# Patient Record
Sex: Female | Born: 1965 | Race: White | Hispanic: No | Smoking: Former smoker
Health system: Southern US, Community
[De-identification: ages and names within clinical notes are randomized; demographics above are authoritative.]

## PROBLEM LIST (undated history)

## (undated) DIAGNOSIS — F329 Major depressive disorder, single episode, unspecified: Secondary | ICD-10-CM

## (undated) DIAGNOSIS — F32A Depression, unspecified: Secondary | ICD-10-CM

## (undated) DIAGNOSIS — J45909 Unspecified asthma, uncomplicated: Secondary | ICD-10-CM

## (undated) DIAGNOSIS — G43909 Migraine, unspecified, not intractable, without status migrainosus: Secondary | ICD-10-CM

---

## 2008-08-11 ENCOUNTER — Emergency Department (HOSPITAL_COMMUNITY): Admission: EM | Admit: 2008-08-11 | Discharge: 2008-08-11 | Payer: Self-pay | Admitting: Emergency Medicine

## 2010-04-15 ENCOUNTER — Inpatient Hospital Stay (HOSPITAL_COMMUNITY)
Admission: AD | Admit: 2010-04-15 | Discharge: 2010-04-15 | Disposition: A | Discharge status: Home / Self Care | Payer: Self-pay | Source: Ambulatory Visit | Attending: Obstetrics and Gynecology | Admitting: Obstetrics and Gynecology

## 2010-04-15 DIAGNOSIS — N644 Mastodynia: Secondary | ICD-10-CM | POA: Insufficient documentation

## 2013-09-15 ENCOUNTER — Encounter (HOSPITAL_COMMUNITY): Payer: Self-pay | Admitting: Emergency Medicine

## 2013-09-15 ENCOUNTER — Emergency Department (HOSPITAL_COMMUNITY): Payer: BC Managed Care – PPO

## 2013-09-15 ENCOUNTER — Emergency Department (HOSPITAL_COMMUNITY)
Admission: EM | Admit: 2013-09-15 | Discharge: 2013-09-15 | Disposition: A | Discharge status: Home / Self Care | Payer: BC Managed Care – PPO | Attending: Emergency Medicine | Admitting: Emergency Medicine

## 2013-09-15 DIAGNOSIS — Z79899 Other long term (current) drug therapy: Secondary | ICD-10-CM | POA: Insufficient documentation

## 2013-09-15 DIAGNOSIS — J45909 Unspecified asthma, uncomplicated: Secondary | ICD-10-CM | POA: Insufficient documentation

## 2013-09-15 DIAGNOSIS — F329 Major depressive disorder, single episode, unspecified: Secondary | ICD-10-CM | POA: Insufficient documentation

## 2013-09-15 DIAGNOSIS — R1084 Generalized abdominal pain: Secondary | ICD-10-CM | POA: Insufficient documentation

## 2013-09-15 DIAGNOSIS — F3289 Other specified depressive episodes: Secondary | ICD-10-CM | POA: Insufficient documentation

## 2013-09-15 DIAGNOSIS — R1030 Lower abdominal pain, unspecified: Secondary | ICD-10-CM

## 2013-09-15 DIAGNOSIS — Z87891 Personal history of nicotine dependence: Secondary | ICD-10-CM | POA: Insufficient documentation

## 2013-09-15 DIAGNOSIS — R109 Unspecified abdominal pain: Secondary | ICD-10-CM | POA: Insufficient documentation

## 2013-09-15 HISTORY — DX: Depression, unspecified: F32.A

## 2013-09-15 HISTORY — DX: Major depressive disorder, single episode, unspecified: F32.9

## 2013-09-15 HISTORY — DX: Unspecified asthma, uncomplicated: J45.909

## 2013-09-15 LAB — COMPREHENSIVE METABOLIC PANEL
ALK PHOS: 70 U/L (ref 39–117)
ALT: 19 U/L (ref 0–35)
AST: 17 U/L (ref 0–37)
Albumin: 3.6 g/dL (ref 3.5–5.2)
Anion gap: 11 (ref 5–15)
BILIRUBIN TOTAL: 0.6 mg/dL (ref 0.3–1.2)
BUN: 10 mg/dL (ref 6–23)
CALCIUM: 8.8 mg/dL (ref 8.4–10.5)
CHLORIDE: 100 meq/L (ref 96–112)
CO2: 25 meq/L (ref 19–32)
Creatinine, Ser: 0.75 mg/dL (ref 0.50–1.10)
GLUCOSE: 79 mg/dL (ref 70–99)
POTASSIUM: 3.9 meq/L (ref 3.7–5.3)
SODIUM: 136 meq/L — AB (ref 137–147)
Total Protein: 7.3 g/dL (ref 6.0–8.3)

## 2013-09-15 LAB — CBC WITH DIFFERENTIAL/PLATELET
Basophils Absolute: 0 10*3/uL (ref 0.0–0.1)
Basophils Relative: 0 % (ref 0–1)
EOS PCT: 1 % (ref 0–5)
Eosinophils Absolute: 0.1 10*3/uL (ref 0.0–0.7)
HCT: 42.1 % (ref 36.0–46.0)
Hemoglobin: 14.2 g/dL (ref 12.0–15.0)
LYMPHS ABS: 1.5 10*3/uL (ref 0.7–4.0)
LYMPHS PCT: 19 % (ref 12–46)
MCH: 32.8 pg (ref 26.0–34.0)
MCHC: 33.7 g/dL (ref 30.0–36.0)
MCV: 97.2 fL (ref 78.0–100.0)
Monocytes Absolute: 0.6 10*3/uL (ref 0.1–1.0)
Monocytes Relative: 8 % (ref 3–12)
NEUTROS ABS: 5.6 10*3/uL (ref 1.7–7.7)
Neutrophils Relative %: 72 % (ref 43–77)
PLATELETS: 253 10*3/uL (ref 150–400)
RBC: 4.33 MIL/uL (ref 3.87–5.11)
RDW: 12 % (ref 11.5–15.5)
WBC: 7.8 10*3/uL (ref 4.0–10.5)

## 2013-09-15 LAB — URINALYSIS, ROUTINE W REFLEX MICROSCOPIC
BILIRUBIN URINE: NEGATIVE
Glucose, UA: NEGATIVE mg/dL
HGB URINE DIPSTICK: NEGATIVE
KETONES UR: NEGATIVE mg/dL
Leukocytes, UA: NEGATIVE
Nitrite: NEGATIVE
PROTEIN: NEGATIVE mg/dL
Specific Gravity, Urine: 1.007 (ref 1.005–1.030)
UROBILINOGEN UA: 0.2 mg/dL (ref 0.0–1.0)
pH: 6.5 (ref 5.0–8.0)

## 2013-09-15 LAB — POC URINE PREG, ED: Preg Test, Ur: NEGATIVE

## 2013-09-15 LAB — I-STAT CG4 LACTIC ACID, ED: LACTIC ACID, VENOUS: 0.5 mmol/L (ref 0.5–2.2)

## 2013-09-15 LAB — LIPASE, BLOOD: LIPASE: 22 U/L (ref 11–59)

## 2013-09-15 MED ORDER — IOHEXOL 300 MG/ML  SOLN
25.0000 mL | INTRAMUSCULAR | Status: AC | PRN
  Administered 2013-09-15 (×2): 25 mL via ORAL

## 2013-09-15 MED ORDER — IOHEXOL 300 MG/ML  SOLN
100.0000 mL | Freq: Once | INTRAMUSCULAR | Status: AC | PRN
  Administered 2013-09-15: 100 mL via INTRAVENOUS

## 2013-09-15 NOTE — ED Notes (Signed)
Patient drinking PO, will notify RN when completed first cup.

## 2013-09-15 NOTE — ED Notes (Signed)
MD at bedside. 

## 2013-09-15 NOTE — ED Notes (Signed)
Patient transported to CT 

## 2013-09-15 NOTE — ED Notes (Signed)
Pt. Stated, I started having abdominal pain for 5 days off and on.

## 2013-09-15 NOTE — Discharge Instructions (Signed)
Abdominal Pain, Women °Abdominal (stomach, pelvic, or belly) pain can be caused by many things. It is important to tell your doctor: °· The location of the pain. °· Does it come and go or is it present all the time? °· Are there things that start the pain (eating certain foods, exercise)? °· Are there other symptoms associated with the pain (fever, nausea, vomiting, diarrhea)? °All of this is helpful to know when trying to find the cause of the pain. °CAUSES  °· Stomach: virus or bacteria infection, or ulcer. °· Intestine: appendicitis (inflamed appendix), regional ileitis (Crohn's disease), ulcerative colitis (inflamed colon), irritable bowel syndrome, diverticulitis (inflamed diverticulum of the colon), or cancer of the stomach or intestine. °· Gallbladder disease or stones in the gallbladder. °· Kidney disease, kidney stones, or infection. °· Pancreas infection or cancer. °· Fibromyalgia (pain disorder). °· Diseases of the female organs: °¨ Uterus: fibroid (non-cancerous) tumors or infection. °¨ Fallopian tubes: infection or tubal pregnancy. °¨ Ovary: cysts or tumors. °¨ Pelvic adhesions (scar tissue). °¨ Endometriosis (uterus lining tissue growing in the pelvis and on the pelvic organs). °¨ Pelvic congestion syndrome (female organs filling up with blood just before the menstrual period). °¨ Pain with the menstrual period. °¨ Pain with ovulation (producing an egg). °¨ Pain with an IUD (intrauterine device, birth control) in the uterus. °¨ Cancer of the female organs. °· Functional pain (pain not caused by a disease, may improve without treatment). °· Psychological pain. °· Depression. °DIAGNOSIS  °Your doctor will decide the seriousness of your pain by doing an examination. °· Blood tests. °· X-rays. °· Ultrasound. °· CT scan (computed tomography, special type of X-ray). °· MRI (magnetic resonance imaging). °· Cultures, for infection. °· Barium enema (dye inserted in the large intestine, to better view it with  X-rays). °· Colonoscopy (looking in intestine with a lighted tube). °· Laparoscopy (minor surgery, looking in abdomen with a lighted tube). °· Major abdominal exploratory surgery (looking in abdomen with a large incision). °TREATMENT  °The treatment will depend on the cause of the pain.  °· Many cases can be observed and treated at home. °· Over-the-counter medicines recommended by your caregiver. °· Prescription medicine. °· Antibiotics, for infection. °· Birth control pills, for painful periods or for ovulation pain. °· Hormone treatment, for endometriosis. °· Nerve blocking injections. °· Physical therapy. °· Antidepressants. °· Counseling with a psychologist or psychiatrist. °· Minor or major surgery. °HOME CARE INSTRUCTIONS  °· Do not take laxatives, unless directed by your caregiver. °· Take over-the-counter pain medicine only if ordered by your caregiver. Do not take aspirin because it can cause an upset stomach or bleeding. °· Try a clear liquid diet (broth or water) as ordered by your caregiver. Slowly move to a bland diet, as tolerated, if the pain is related to the stomach or intestine. °· Have a thermometer and take your temperature several times a day, and record it. °· Bed rest and sleep, if it helps the pain. °· Avoid sexual intercourse, if it causes pain. °· Avoid stressful situations. °· Keep your follow-up appointments and tests, as your caregiver orders. °· If the pain does not go away with medicine or surgery, you may try: °¨ Acupuncture. °¨ Relaxation exercises (yoga, meditation). °¨ Group therapy. °¨ Counseling. °SEEK MEDICAL CARE IF:  °· You notice certain foods cause stomach pain. °· Your home care treatment is not helping your pain. °· You need stronger pain medicine. °· You want your IUD removed. °· You feel faint or   lightheaded. °· You develop nausea and vomiting. °· You develop a rash. °· You are having side effects or an allergy to your medicine. °SEEK IMMEDIATE MEDICAL CARE IF:  °· Your  pain does not go away or gets worse. °· You have a fever. °· Your pain is felt only in portions of the abdomen. The right side could possibly be appendicitis. The left lower portion of the abdomen could be colitis or diverticulitis. °· You are passing blood in your stools (bright red or black tarry stools, with or without vomiting). °· You have blood in your urine. °· You develop chills, with or without a fever. °· You pass out. °MAKE SURE YOU:  °· Understand these instructions. °· Will watch your condition. °· Will get help right away if you are not doing well or get worse. °Document Released: 11/29/2006 Document Revised: 06/18/2013 Document Reviewed: 12/19/2008 °Exit[employer]® Patient Information ©2015 [employer]. This information is not intended to replace advice given to you by your health care provider. Make sure you discuss any questions you have with your health care provider. ° °

## 2013-09-15 NOTE — ED Provider Notes (Signed)
CSN: [id number]     Arrival date & time 09/15/13  1024 History   First MD Initiated Contact with Patient 09/15/13 1038     Chief Complaint  Patient presents with  . Abdominal Pain     (Consider location/radiation/quality/duration/timing/severity/associated sxs/prior Treatment) Patient is a 48 y.o. female presenting with abdominal pain. The history is provided by the patient. No language interpreter was used.  Abdominal Pain Pain location:  Suprapubic, LLQ and RLQ Pain quality: aching and cramping   Pain radiates to:  Back Pain severity:  Severe Onset quality:  Sudden Duration:  1 week Timing:  Intermittent Progression:  Waxing and waning Chronicity:  Recurrent (1 similar episode about 2 months ago) Context: not recent illness, not sick contacts, not suspicious food intake and not trauma   Relieved by:  Nothing Worsened by:  Nothing tried Ineffective treatments:  None tried Associated symptoms: fever and nausea   Associated symptoms: no chest pain, no chills, no constipation, no cough, no diarrhea, no dysuria, no fatigue, no hematochezia, no hematuria, no shortness of breath, no sore throat, no vaginal bleeding, no vaginal discharge and no vomiting   Associated symptoms comment:  Urinary frequency  Fever:    Duration:  1 day   Timing:  Intermittent   Temp source:  Subjective   Progression:  Waxing and waning   Past Medical History  Diagnosis Date  . Depression   . Asthma    History reviewed. No pertinent past surgical history. No family history on file. History  Substance Use Topics  . Smoking status: Former Games developer  . Smokeless tobacco: Not on file  . Alcohol Use: Yes   OB History   Grav Para Term Preterm Abortions TAB SAB Ect Mult Living                 Review of Systems  Constitutional: Positive for fever. Negative for chills, diaphoresis, activity change, appetite change and fatigue.  HENT: Negative for congestion, facial swelling, rhinorrhea and sore throat.    Eyes: Negative for photophobia and discharge.  Respiratory: Negative for cough, chest tightness and shortness of breath.   Cardiovascular: Negative for chest pain, palpitations and leg swelling.  Gastrointestinal: Positive for nausea and abdominal pain. Negative for vomiting, diarrhea, constipation and hematochezia.  Endocrine: Negative for polydipsia and polyuria.  Genitourinary: Negative for dysuria, frequency, hematuria, vaginal bleeding, vaginal discharge, difficulty urinating and pelvic pain.  Musculoskeletal: Negative for arthralgias, back pain, neck pain and neck stiffness.  Skin: Negative for color change and wound.  Allergic/Immunologic: Negative for immunocompromised state.  Neurological: Negative for facial asymmetry, weakness, numbness and headaches.  Hematological: Does not bruise/bleed easily.  Psychiatric/Behavioral: Negative for confusion and agitation.      Allergies  Aspirin; Other; and Peanuts  Home Medications   Prior to Admission medications   Medication Sig Start Date End Date Taking? Authorizing Provider  albuterol (PROVENTIL HFA;VENTOLIN HFA) 108 (90 BASE) MCG/ACT inhaler Inhale 2 puffs into the lungs every 6 (six) hours as needed for wheezing or shortness of breath.   Yes Historical Provider, MD  buPROPion (WELLBUTRIN SR) 150 MG 12 hr tablet Take 150 mg by mouth every evening.   Yes Historical Provider, MD  buPROPion (WELLBUTRIN SR) 200 MG 12 hr tablet Take 200 mg by mouth every morning.   Yes Historical Provider, MD  fluticasone (FLONASE) 50 MCG/ACT nasal spray Place 2 sprays into both nostrils daily.   Yes Historical Provider, MD  Lactobacillus (ACIDOPHILUS PO) Take 1 capsule by mouth every  morning.   Yes Historical Provider, MD  Multiple Vitamin (MULTIVITAMIN WITH MINERALS) TABS tablet Take 1 tablet by mouth daily.   Yes Historical Provider, MD   BP 114/80  Pulse 71  Temp(Src) 97.5 F (36.4 C) (Oral)  Resp 16  Ht 5\' 4"  (1.626 m)  Wt 136 lb (61.689 kg)   BMI 23.33 kg/m2  SpO2 99%  LMP 09/01/2013 Physical Exam  Constitutional: She is oriented to person, place, and time. She appears well-developed and well-nourished. No distress.  HENT:  Head: Normocephalic and atraumatic.  Mouth/Throat: No oropharyngeal exudate.  Eyes: Pupils are equal, round, and reactive to light.  Neck: Normal range of motion. Neck supple.  Cardiovascular: Normal rate, regular rhythm and normal heart sounds.  Exam reveals no gallop and no friction rub.   No murmur heard. Pulmonary/Chest: Effort normal and breath sounds normal. No respiratory distress. She has no wheezes. She has no rales.  Abdominal: Soft. Bowel sounds are normal. She exhibits no distension and no mass. There is tenderness in the suprapubic area. There is no rigidity, no rebound, no guarding and no CVA tenderness.  Musculoskeletal: Normal range of motion. She exhibits no edema and no tenderness.  Neurological: She is alert and oriented to person, place, and time.  Skin: Skin is warm and dry.  Psychiatric: She has a normal mood and affect.    ED Course  Procedures (including critical care time) Labs Review Labs Reviewed  COMPREHENSIVE METABOLIC PANEL - Abnormal; Notable for the following:    Sodium 136 (*)    All other components within normal limits  URINE CULTURE  CBC WITH DIFFERENTIAL  URINALYSIS, ROUTINE W REFLEX MICROSCOPIC  LIPASE, BLOOD  POC URINE PREG, ED  I-STAT CG4 LACTIC ACID, ED    Imaging Review Ct Abdomen Pelvis W Contrast  09/15/2013   CLINICAL DATA:  Five day history of intermittent lower abdominal pain  EXAM: CT ABDOMEN AND PELVIS WITH CONTRAST  TECHNIQUE: Multidetector CT imaging of the abdomen and pelvis was performed using the standard protocol following bolus administration of intravenous contrast.  CONTRAST:  OMNIPAQUE IOHEXOL 300 MG/ML  SOLN  COMPARISON:  None.  FINDINGS: Lower Chest: The lung bases are clear. Visualized cardiac structures are within normal limits  for size. No pericardial effusion. Unremarkable visualized distal thoracic esophagus.  Abdomen: Unremarkable CT appearance of the stomach, duodenum, spleen, adrenal glands, pancreas and liver. Gallbladder is unremarkable. No intra or extrahepatic biliary ductal dilatation. Normal renal parenchymal enhancement. No hydronephrosis. Punctate high attenuation in the lower pole of the right kidney consistent with a tiny renal stone. Large colonic stool burden suggests constipation. Normal appendix in the right lower quadrant. Incidental note is made of a short segment enteroenteric intussusception in the left hemi abdomen. This is likely incidental and not clinically significant. No free fluid or suspicious adenopathy  Pelvis: 3.9 cm water attenuation cyst affiliated with the left adnexa. The uterus and right adnexa are unremarkable. Unremarkable appearance of the bladder.  Bones/Soft Tissues: No acute fracture or aggressive appearing lytic or blastic osseous lesion.  Vascular: No significant atherosclerotic vascular disease, aneurysmal dilatation or acute abnormality.  IMPRESSION: 1. No acute abnormality in the abdomen or pelvis to explain the patient's clinical symptoms. 2. Punctate nonobstructing stone in the lower pole of the right kidney. 3. 3.9 cm water attenuation cyst affiliated with the left ovary. If the patient is not postmenopausal this is almost certainly benign and a further imaging follow-up would not be required. If, however the patient is  postmenopausal then this finding is still likely benign but warrants further evaluation with nonemergent pelvic ultrasound.   Electronically Signed   By: Malachy MoanHeath  McCullough M.D.   On: 09/15/2013 15:18     EKG Interpretation None      MDM   Final diagnoses:  Abdominal pain, lower    Pt is a 48 y.o. female with Pmhx as above who presents with about 1wk of intermittent low abdominal pain w/ radiation to the back, nausea, urinary frequency, subjective  fever/chills. No vom, d/a, hematuria or blood in stool, vaginal bleeding or d/c. Pt's pain currently much improved since last night. On PE, VSS, pt in NAD. +suprapubic ttp w/o rebound or guarding. No CVA tenderness. CBC, CMP, lipase unremarkable. UA nml. No acute findings on CT ab/pelvis. Pt as tolerated PO contrast w/o difficulty and is well-appearing. Will d/c home w/ Return precautions given for new or worsening symptoms including worsening pain, fever, inability to tolerate PO.           Shanna CiscoMegan E Docherty, MD 09/15/13 [phone]

## 2013-09-16 LAB — URINE CULTURE: Colony Count: 25000

## 2013-11-12 ENCOUNTER — Emergency Department (HOSPITAL_COMMUNITY): Payer: BC Managed Care – PPO

## 2013-11-12 ENCOUNTER — Emergency Department (HOSPITAL_COMMUNITY)
Admission: EM | Admit: 2013-11-12 | Discharge: 2013-11-12 | Discharge status: Against Medical Advice | Payer: BC Managed Care – PPO | Attending: Emergency Medicine | Admitting: Emergency Medicine

## 2013-11-12 ENCOUNTER — Encounter (HOSPITAL_COMMUNITY): Payer: Self-pay | Admitting: Emergency Medicine

## 2013-11-12 DIAGNOSIS — Z87891 Personal history of nicotine dependence: Secondary | ICD-10-CM | POA: Diagnosis not present

## 2013-11-12 DIAGNOSIS — J45909 Unspecified asthma, uncomplicated: Secondary | ICD-10-CM | POA: Diagnosis not present

## 2013-11-12 HISTORY — DX: Migraine, unspecified, not intractable, without status migrainosus: G43.909

## 2013-11-12 LAB — CBC
HCT: 44.7 % (ref 36.0–46.0)
Hemoglobin: 15.2 g/dL — ABNORMAL HIGH (ref 12.0–15.0)
MCH: 33.1 pg (ref 26.0–34.0)
MCHC: 34 g/dL (ref 30.0–36.0)
MCV: 97.4 fL (ref 78.0–100.0)
Platelets: 270 10*3/uL (ref 150–400)
RBC: 4.59 MIL/uL (ref 3.87–5.11)
RDW: 12.6 % (ref 11.5–15.5)
WBC: 5.2 10*3/uL (ref 4.0–10.5)

## 2013-11-12 LAB — BASIC METABOLIC PANEL
Anion gap: 14 (ref 5–15)
BUN: 14 mg/dL (ref 6–23)
CALCIUM: 9.4 mg/dL (ref 8.4–10.5)
CO2: 27 mEq/L (ref 19–32)
Chloride: 102 mEq/L (ref 96–112)
Creatinine, Ser: 0.88 mg/dL (ref 0.50–1.10)
GFR, EST AFRICAN AMERICAN: 87 mL/min — AB (ref >90)
GFR, EST NON AFRICAN AMERICAN: 75 mL/min — AB (ref >90)
Glucose, Bld: 83 mg/dL (ref 70–99)
POTASSIUM: 4.1 meq/L (ref 3.7–5.3)
SODIUM: 143 meq/L (ref 137–147)

## 2013-11-12 NOTE — ED Notes (Signed)
Per pt, states she has an asthma attack early this am-cleared up around 7 am-states now she just does not feel good-no difficulty breathing

## 2013-11-12 NOTE — ED Notes (Signed)
Pt and a female visitor came up to the Registration window asking how much longer before she was put in a room.  This Management consultant and explained that unfortunately there have been other patients that needed to be taken back first.  I explained that she had two people ahead of her, we had a few patients up for discharge, and we would get her back hopefully, in 15-30 mins.  Pt's female visitor stated that they would like to leave and she needs a note for work.  This RN told them that I would write her a note.

## 2013-11-12 NOTE — ED Notes (Addendum)
After having to search for a template this RN provided a note that stated that the Pt had been here and left prior to being seen by a provider/prior to treatment.  The Pt's female visitor stated "what happened to the 15 minutes that you told me 20 minutes ago?  That last lady went back and hadn't been waiting as long as we have.  If I would have been up to the window making a scene, then we would have gotten back.  Others have been cussing and fussing and then, magically, within 5 minutes, they have a room.  We've tried to be nice, but we should have made a scene."  This RN apologized, again, and briefly explained that the last Pt's condition caused her to get a room first and I can only provide time estimates.  The female visitor then asked for this Charge RN's first and last night, which were provided.  Female visitor took the note and walked the Pt out of the department.  This Management consultant, again, for the delay.      Registration reported to this Charge RN that the female visitor was taking pictures and kept repeating that they should have made a scene.

## 2014-01-20 ENCOUNTER — Encounter (HOSPITAL_COMMUNITY): Payer: Self-pay | Admitting: *Deleted

## 2014-01-20 ENCOUNTER — Emergency Department (HOSPITAL_COMMUNITY)
Admission: EM | Admit: 2014-01-20 | Discharge: 2014-01-21 | Disposition: A | Discharge status: Home / Self Care | Payer: BC Managed Care – PPO | Attending: Emergency Medicine | Admitting: Emergency Medicine

## 2014-01-20 ENCOUNTER — Emergency Department (HOSPITAL_COMMUNITY): Payer: BC Managed Care – PPO

## 2014-01-20 DIAGNOSIS — Z7951 Long term (current) use of inhaled steroids: Secondary | ICD-10-CM | POA: Insufficient documentation

## 2014-01-20 DIAGNOSIS — G43909 Migraine, unspecified, not intractable, without status migrainosus: Secondary | ICD-10-CM | POA: Diagnosis not present

## 2014-01-20 DIAGNOSIS — R059 Cough, unspecified: Secondary | ICD-10-CM

## 2014-01-20 DIAGNOSIS — F419 Anxiety disorder, unspecified: Secondary | ICD-10-CM | POA: Diagnosis not present

## 2014-01-20 DIAGNOSIS — F329 Major depressive disorder, single episode, unspecified: Secondary | ICD-10-CM | POA: Insufficient documentation

## 2014-01-20 DIAGNOSIS — Z87891 Personal history of nicotine dependence: Secondary | ICD-10-CM | POA: Diagnosis not present

## 2014-01-20 DIAGNOSIS — J069 Acute upper respiratory infection, unspecified: Secondary | ICD-10-CM | POA: Diagnosis not present

## 2014-01-20 DIAGNOSIS — Z79899 Other long term (current) drug therapy: Secondary | ICD-10-CM | POA: Insufficient documentation

## 2014-01-20 DIAGNOSIS — J45909 Unspecified asthma, uncomplicated: Secondary | ICD-10-CM | POA: Diagnosis present

## 2014-01-20 DIAGNOSIS — R05 Cough: Secondary | ICD-10-CM

## 2014-01-20 MED ORDER — ALBUTEROL SULFATE (2.5 MG/3ML) 0.083% IN NEBU
2.5000 mg | INHALATION_SOLUTION | Freq: Once | RESPIRATORY_TRACT | Status: AC
  Administered 2014-01-20: 2.5 mg via RESPIRATORY_TRACT
  Filled 2014-01-20: qty 3

## 2014-01-20 NOTE — ED Provider Notes (Signed)
CSN: [id number]     Arrival date & time 01/20/14  2221 History  This chart was scribed for Selinda MichaelsLauren Par, PA-C, working with Toy CookeyMegan Docherty, MD by Chestine SporeSoijett Blue, ED Scribe. The patient was seen in room TR09C/TR09C at 10:53 PM.   Chief Complaint  Patient presents with  . Asthma     HPI Comments: Pam Thomas is a 48 y.o. female who presents to the Emergency Department complaining of asthma onset PTA. She feels like her chest is "full" and yesterday she felt that she could cough it up but couldn't. She was seen at her PCP 2 days ago and was Rx augmentin that was changed to Avelox. She has an inhaler that she has used three times today. She reports that when it gets bad she has anxiety. She states that she is having associated symptoms of lightheaded, dizziness, chest tightness, cough. She states that she has tried her inhaler with no relief for her symptoms. Patient also complains of lack of sleep over the last one week.  She reports waking up "gasping for air, but denies shortness of breath, relating it to anxiety. No recent travel, family history or personal history of DVT/PE, lower extremity swelling, smoking, cancer, or exogenous estrogen.  She denies wheezing, CP, leg swelling, fever, and any other symptoms. She reports that her breathing is now mildly resolved. She reports that she will wake up gasping for air at night and that has happened before. She denies smoking.  The history is provided by the patient. No language interpreter was used.    Past Medical History  Diagnosis Date  . Depression   . Asthma   . Migraine    History reviewed. No pertinent past surgical history. History reviewed. No pertinent family history. History  Substance Use Topics  . Smoking status: Former Games developermoker  . Smokeless tobacco: Not on file  . Alcohol Use: Yes   OB History    No data available     Review of Systems  Constitutional: Negative for fever.  Respiratory: Positive for cough and chest tightness.  Negative for wheezing.   Cardiovascular: Negative for chest pain, palpitations and leg swelling.  Neurological: Positive for light-headedness. Negative for syncope, numbness and headaches.      Allergies  Aspirin; Other; and Peanuts  Home Medications   Prior to Admission medications   Medication Sig Start Date End Date Taking? Authorizing Provider  albuterol (PROVENTIL HFA;VENTOLIN HFA) 108 (90 BASE) MCG/ACT inhaler Inhale 2 puffs into the lungs every 6 (six) hours as needed for wheezing or shortness of breath.    Historical Provider, MD  buPROPion (WELLBUTRIN SR) 150 MG 12 hr tablet Take 150 mg by mouth every evening.    Historical Provider, MD  buPROPion (WELLBUTRIN SR) 200 MG 12 hr tablet Take 200 mg by mouth every morning.    Historical Provider, MD  fluticasone (FLONASE) 50 MCG/ACT nasal spray Place 2 sprays into both nostrils daily.    Historical Provider, MD  Lactobacillus (ACIDOPHILUS PO) Take 1 capsule by mouth every morning.    Historical Provider, MD  Multiple Vitamin (MULTIVITAMIN WITH MINERALS) TABS tablet Take 1 tablet by mouth daily.    Historical Provider, MD   BP 147/74 mmHg  Pulse 77  Temp(Src) 97.8 F (36.6 C)  Resp 18  Ht 5\' 4"  (1.626 m)  Wt 137 lb (62.143 kg)  BMI 23.50 kg/m2  SpO2 100% Physical Exam  Constitutional: She is oriented to person, place, and time. She appears well-developed and well-nourished.  Non-toxic appearance. She does not have a sickly appearance. No distress.  HENT:  Head: Normocephalic and atraumatic.  Eyes: EOM are normal.  Neck: Neck supple.  Cardiovascular: Normal rate, regular rhythm and normal heart sounds.   No lower extremity edema.  Pulmonary/Chest: Effort normal and breath sounds normal. No respiratory distress. She has no decreased breath sounds. She has no wheezes. She has no rhonchi. She has no rales.  Patient is able to speak in complete sentences.   Musculoskeletal: Normal range of motion.  Neurological: She is alert  and oriented to person, place, and time.  Skin: Skin is warm and dry.  Psychiatric: Her behavior is normal. Her mood appears anxious.  Nursing note and vitals reviewed.   ED Course  Procedures (including critical care time) COORDINATION OF CARE: 11:00 PM-Discussed treatment plan which includes CXR, breathing treating, EKG with pt at bedside and pt agreed to plan.   Labs Review Labs Reviewed - No data to display  Imaging Review Dg Chest 2 View  01/21/2014   CLINICAL DATA:  Asthma attack tonight, cough, symptoms improved after inhaler use, former smoker  EXAM: CHEST  2 VIEW  COMPARISON:  11/12/2013  FINDINGS: Normal heart size, mediastinal contours, and pulmonary vascularity.  Lungs clear.  No pleural effusion or pneumothorax.  Bones unremarkable.  IMPRESSION: Normal exam.   Electronically Signed   By: Ulyses SouthwardMark  Boles M.D.   On: 01/21/2014 00:12     EKG Interpretation None      MDM   Final diagnoses:  Cough  URI (upper respiratory infection)   Patient presents with upper respiratory symptoms and asthma symptoms. Lungs clear to auscultation, patient SPO2 100% on room air, RRRR, afebrile. Patients symptoms likely combination of URI and anxiety. X-ray ordered, plan to walk the patient with pulse ox. Doubt PE or ACS. Patient is not having any chest pain or shortness of breath. X-ray negative, patient maintain SPO2 97-99% on room air with ambulation.  12:26 AM Reevaluation patient resting comfortably in room. She reports relief after nebulizer treatment. Upon further discussion patient admits she has had a "rough week ", stating that her daughter was missing for several days which caused her a lot of anxiety. She reports she did not get much sleep due to this and that the cold and added stress and lack of sleep made her feel worse. Discussed need for Mucinex DM, with likely viral illness and follow-up with primary care. Discussed imaging results, and treatment plan with the patient. Return  precautions given. Reports understanding and no other concerns at this time.  Patient is stable for discharge at this time. Meds given in ED:  Medications  albuterol (PROVENTIL) (2.5 MG/3ML) 0.083% nebulizer solution 2.5 mg (2.5 mg Nebulization Given 01/20/14 2347)    New Prescriptions   No medications on file   I personally performed the services described in this documentation, which was scribed in my presence. The recorded information has been reviewed and is accurate.    Mellody DrownLauren Tangi Shroff, PA-C 01/21/14 [id number]  Toy CookeyMegan Docherty, MD 01/21/14 1302

## 2014-01-20 NOTE — ED Notes (Signed)
Maintained sat of 97-99 while walking fast.  P.  84

## 2014-01-20 NOTE — ED Notes (Signed)
Pt in c/o asthma symptoms, states she has had an URI for the last week, increased coughing, seen at PCP and started on antibiotics for sinus infection, used inhaler at home without relief, pt speaking in full sentences, no distress noted

## 2014-01-20 NOTE — ED Notes (Addendum)
Pt reports having URI for the past week, was seen by pcp Friday for sinus infection. Pt started antibiotics yesterday. Pt also using inhaler for sob. No distress noted. Pt also reports dizziness.

## 2014-01-21 NOTE — Discharge Instructions (Signed)
Call for a follow up appointment with a Family or Primary Care Provider.  Return if Symptoms worsen.   Take medication as prescribed.  Take Mucinex DM over-the-counter as directed. Drink plenty of fluids, salt water gargles 3-4 times a day.

## 2015-02-04 ENCOUNTER — Emergency Department (HOSPITAL_COMMUNITY)
Admission: EM | Admit: 2015-02-04 | Discharge: 2015-02-04 | Disposition: A | Discharge status: Other Acute Inpatient Hospital | Payer: Self-pay | Attending: Emergency Medicine | Admitting: Emergency Medicine

## 2015-02-04 ENCOUNTER — Inpatient Hospital Stay (HOSPITAL_COMMUNITY)
Admission: RE | Admit: 2015-02-04 | Discharge: 2015-02-11 | DRG: 885 | Disposition: A | Discharge status: Home / Self Care | Payer: Federal, State, Local not specified - Other | Attending: Psychiatry | Admitting: Psychiatry

## 2015-02-04 ENCOUNTER — Inpatient Hospital Stay (HOSPITAL_COMMUNITY): Admit: 2015-02-04 | Payer: Self-pay

## 2015-02-04 ENCOUNTER — Encounter (HOSPITAL_COMMUNITY): Payer: Self-pay

## 2015-02-04 ENCOUNTER — Encounter (HOSPITAL_COMMUNITY): Payer: Self-pay | Admitting: *Deleted

## 2015-02-04 DIAGNOSIS — Z23 Encounter for immunization: Secondary | ICD-10-CM | POA: Diagnosis not present

## 2015-02-04 DIAGNOSIS — F4310 Post-traumatic stress disorder, unspecified: Secondary | ICD-10-CM | POA: Diagnosis present

## 2015-02-04 DIAGNOSIS — F419 Anxiety disorder, unspecified: Secondary | ICD-10-CM | POA: Diagnosis present

## 2015-02-04 DIAGNOSIS — G43909 Migraine, unspecified, not intractable, without status migrainosus: Secondary | ICD-10-CM | POA: Insufficient documentation

## 2015-02-04 DIAGNOSIS — J45909 Unspecified asthma, uncomplicated: Secondary | ICD-10-CM | POA: Diagnosis present

## 2015-02-04 DIAGNOSIS — F332 Major depressive disorder, recurrent severe without psychotic features: Secondary | ICD-10-CM | POA: Diagnosis present

## 2015-02-04 DIAGNOSIS — G4700 Insomnia, unspecified: Secondary | ICD-10-CM | POA: Diagnosis present

## 2015-02-04 DIAGNOSIS — Z87891 Personal history of nicotine dependence: Secondary | ICD-10-CM | POA: Insufficient documentation

## 2015-02-04 DIAGNOSIS — F329 Major depressive disorder, single episode, unspecified: Secondary | ICD-10-CM | POA: Insufficient documentation

## 2015-02-04 DIAGNOSIS — F4000 Agoraphobia, unspecified: Secondary | ICD-10-CM | POA: Diagnosis present

## 2015-02-04 DIAGNOSIS — F32A Depression, unspecified: Secondary | ICD-10-CM

## 2015-02-04 DIAGNOSIS — F1310 Sedative, hypnotic or anxiolytic abuse, uncomplicated: Secondary | ICD-10-CM | POA: Insufficient documentation

## 2015-02-04 DIAGNOSIS — F4323 Adjustment disorder with mixed anxiety and depressed mood: Secondary | ICD-10-CM | POA: Diagnosis not present

## 2015-02-04 DIAGNOSIS — Z7951 Long term (current) use of inhaled steroids: Secondary | ICD-10-CM | POA: Insufficient documentation

## 2015-02-04 DIAGNOSIS — Z79899 Other long term (current) drug therapy: Secondary | ICD-10-CM | POA: Insufficient documentation

## 2015-02-04 LAB — CBC
HCT: 43.4 % (ref 36.0–46.0)
Hemoglobin: 14.4 g/dL (ref 12.0–15.0)
MCH: 33.4 pg (ref 26.0–34.0)
MCHC: 33.2 g/dL (ref 30.0–36.0)
MCV: 100.7 fL — ABNORMAL HIGH (ref 78.0–100.0)
PLATELETS: 327 10*3/uL (ref 150–400)
RBC: 4.31 MIL/uL (ref 3.87–5.11)
RDW: 12.3 % (ref 11.5–15.5)
WBC: 8.4 10*3/uL (ref 4.0–10.5)

## 2015-02-04 LAB — COMPREHENSIVE METABOLIC PANEL
ALBUMIN: 4.2 g/dL (ref 3.5–5.0)
ALT: 42 U/L (ref 14–54)
ANION GAP: 9 (ref 5–15)
AST: 39 U/L (ref 15–41)
Alkaline Phosphatase: 62 U/L (ref 38–126)
BUN: 14 mg/dL (ref 6–20)
CO2: 26 mmol/L (ref 22–32)
Calcium: 8.5 mg/dL — ABNORMAL LOW (ref 8.9–10.3)
Chloride: 106 mmol/L (ref 101–111)
Creatinine, Ser: 0.89 mg/dL (ref 0.44–1.00)
GFR calc Af Amer: >60 mL/min (ref >60)
GFR calc non Af Amer: >60 mL/min (ref >60)
GLUCOSE: 90 mg/dL (ref 65–99)
POTASSIUM: 3.9 mmol/L (ref 3.5–5.1)
SODIUM: 141 mmol/L (ref 135–145)
Total Bilirubin: 0.9 mg/dL (ref 0.3–1.2)
Total Protein: 7.2 g/dL (ref 6.5–8.1)

## 2015-02-04 LAB — RAPID URINE DRUG SCREEN, HOSP PERFORMED
AMPHETAMINES: NOT DETECTED
BARBITURATES: NOT DETECTED
Benzodiazepines: POSITIVE — AB
COCAINE: NOT DETECTED
OPIATES: NOT DETECTED
TETRAHYDROCANNABINOL: NOT DETECTED

## 2015-02-04 LAB — ACETAMINOPHEN LEVEL

## 2015-02-04 LAB — ETHANOL

## 2015-02-04 LAB — SALICYLATE LEVEL

## 2015-02-04 MED ORDER — BUPROPION HCL ER (SR) 150 MG PO TB12
150.0000 mg | ORAL_TABLET | Freq: Every evening | ORAL | Status: DC
Start: 2015-02-04 — End: 2015-02-05
  Filled 2015-02-04 (×3): qty 1

## 2015-02-04 MED ORDER — ALUM & MAG HYDROXIDE-SIMETH 200-200-20 MG/5ML PO SUSP: 30.0000 mL | ORAL | Status: DC | PRN

## 2015-02-04 MED ORDER — BUSPIRONE HCL 10 MG PO TABS
10.0000 mg | ORAL_TABLET | Freq: Three times a day (TID) | ORAL | Status: DC
  Administered 2015-02-05 (×2): 10 mg via ORAL
  Filled 2015-02-04 (×7): qty 1

## 2015-02-04 MED ORDER — INFLUENZA VAC SPLIT QUAD 0.5 ML IM SUSY
0.5000 mL | PREFILLED_SYRINGE | INTRAMUSCULAR | Status: AC
  Administered 2015-02-05: 0.5 mL via INTRAMUSCULAR
  Filled 2015-02-04: qty 0.5

## 2015-02-04 MED ORDER — PNEUMOCOCCAL VAC POLYVALENT 25 MCG/0.5ML IJ INJ
0.5000 mL | INJECTION | INTRAMUSCULAR | Status: AC
  Administered 2015-02-05: 0.5 mL via INTRAMUSCULAR

## 2015-02-04 MED ORDER — BUPROPION HCL ER (SR) 100 MG PO TB12
200.0000 mg | ORAL_TABLET | Freq: Every day | ORAL | Status: DC
  Administered 2015-02-05 – 2015-02-08 (×4): 200 mg via ORAL
  Filled 2015-02-04: qty 14
  Filled 2015-02-04 (×5): qty 2

## 2015-02-04 MED ORDER — ACETAMINOPHEN 325 MG PO TABS: 650.0000 mg | ORAL_TABLET | ORAL | Status: DC | PRN

## 2015-02-04 MED ORDER — HYDROXYZINE HCL 25 MG PO TABS
25.0000 mg | ORAL_TABLET | Freq: Four times a day (QID) | ORAL | Status: DC | PRN
  Administered 2015-02-04: 25 mg via ORAL
  Filled 2015-02-04: qty 1

## 2015-02-04 MED ORDER — LORAZEPAM 1 MG PO TABS
1.0000 mg | ORAL_TABLET | Freq: Three times a day (TID) | ORAL | Status: DC | PRN
  Administered 2015-02-04: 1 mg via ORAL
  Filled 2015-02-04: qty 1

## 2015-02-04 MED ORDER — QUETIAPINE FUMARATE 25 MG PO TABS
25.0000 mg | ORAL_TABLET | Freq: Every evening | ORAL | Status: DC | PRN
  Administered 2015-02-04: 25 mg via ORAL
  Filled 2015-02-04 (×7): qty 1

## 2015-02-04 MED ORDER — ALBUTEROL SULFATE HFA 108 (90 BASE) MCG/ACT IN AERS: 2.0000 | INHALATION_SPRAY | Freq: Four times a day (QID) | RESPIRATORY_TRACT | Status: DC | PRN

## 2015-02-04 MED ORDER — ACETAMINOPHEN 325 MG PO TABS
650.0000 mg | ORAL_TABLET | Freq: Four times a day (QID) | ORAL | Status: DC | PRN
  Administered 2015-02-06 – 2015-02-08 (×2): 650 mg via ORAL
  Filled 2015-02-04 (×2): qty 2

## 2015-02-04 MED ORDER — ONDANSETRON HCL 4 MG PO TABS: 4.0000 mg | ORAL_TABLET | Freq: Three times a day (TID) | ORAL | Status: DC | PRN

## 2015-02-04 MED ORDER — MAGNESIUM HYDROXIDE 400 MG/5ML PO SUSP
30.0000 mL | Freq: Every day | ORAL | Status: DC | PRN
Start: 2015-02-04 — End: 2015-02-11

## 2015-02-04 NOTE — BH Assessment (Signed)
[facility] Renata Capriceonrad, NP. Patient's bed assignment is 402-1. [facility]. Patient completed support paperwork.

## 2015-02-04 NOTE — ED Notes (Signed)
Pt AAO x 3, no distress noted, calm & cooperative,  [facility].  Monitoring for safety, Q 15 min checks in effect.

## 2015-02-04 NOTE — Progress Notes (Signed)
Pt is a [age]year old female admitted with depression and anxiety   She is grieving over the loss of her daughter to an overdose of medication in September of this year    She reports feeling anxious having poor concentration and difficulty completeing simple tasks   She presents as nervous and hypervigilent   She is also pleasant and cooperative during the assessment   Pt denies suicidal ideation but sometimes the thought crosses her mind but then she remembers her other daughter   Pt was offered nourishment and given verbal support    Received orders and administered medications and will monitor for effectiveness    Pt was oriented to the unit   Q 15 min checks    Pt is adjusting well and is presently safe

## 2015-02-04 NOTE — ED Notes (Signed)
Report called to RN Boyd KerbsPenny, Pending [facility].

## 2015-02-04 NOTE — ED Notes (Signed)
Bed: WBH38 Expected date:  Expected time:  Means of arrival:  Comments: Triage 5 

## 2015-02-04 NOTE — Tx Team (Signed)
Initial Interdisciplinary Treatment Plan   PATIENT STRESSORS: Medication change or noncompliance Traumatic event   PATIENT STRENGTHS: Average or above average intelligence General fund of knowledge Supportive family/friends Work skills   PROBLEM LIST: Problem List/Patient Goals Date to be addressed Date deferred Reason deferred Estimated date of resolution  "I want help with my depression and anxiety"                                                       DISCHARGE CRITERIA:  Improved stabilization in mood, thinking, and/or behavior Verbal commitment to aftercare and medication compliance  PRELIMINARY DISCHARGE PLAN: Attend aftercare/continuing care group Return to previous living arrangement  PATIENT/FAMIILY INVOLVEMENT: This treatment plan has been presented to and reviewed with the patient, Pam Thomas, and/or family member, .  The patient and family have been given the opportunity to ask questions and make suggestions.  [name], [name] J 02/04/2015, 10:59 PM

## 2015-02-04 NOTE — ED Provider Notes (Signed)
CSN: [id number]     Arrival date & time 02/04/15  1538 History   None    Chief Complaint  Patient presents with  . Medical Clearance     (Consider location/radiation/quality/duration/timing/severity/associated sxs/prior Treatment) Patient is a 49 y.o. female presenting with depression. The history is provided by the patient. No language interpreter was used.  Depression This is a new problem. The current episode started more than 1 month ago. The problem occurs constantly. The problem has been gradually worsening. Pertinent negatives include no myalgias. Nothing aggravates the symptoms. She has tried nothing for the symptoms. The treatment provided moderate relief.  Pt reports she has depression and anxiety since her daughters death in Nov 22, 2022.  Pt went to [facility] and was sent here for clearance.    Past Medical History  Diagnosis Date  . Depression   . Asthma   . Migraine    History reviewed. No pertinent past surgical history. No family history on file. Social History  Substance Use Topics  . Smoking status: Former Games developer  . Smokeless tobacco: None  . Alcohol Use: Yes   OB History    No data available     Review of Systems  Musculoskeletal: Negative for myalgias.  Psychiatric/Behavioral: Positive for depression.  All other systems reviewed and are negative.     Allergies  Aspirin; Other; Peanuts; and Trazodone and nefazodone  Home Medications   Prior to Admission medications   Medication Sig Start Date End Date Taking? Authorizing Provider  albuterol (PROVENTIL HFA;VENTOLIN HFA) 108 (90 BASE) MCG/ACT inhaler Inhale 2 puffs into the lungs every 6 (six) hours as needed for wheezing or shortness of breath.    Historical Provider, MD  buPROPion (WELLBUTRIN SR) 150 MG 12 hr tablet Take 150 mg by mouth every evening.    Historical Provider, MD  buPROPion (WELLBUTRIN SR) 200 MG 12 hr tablet Take 200 mg by mouth every morning.    Historical Provider, MD  fluticasone  (FLONASE) 50 MCG/ACT nasal spray Place 2 sprays into both nostrils daily.    Historical Provider, MD  Lactobacillus (ACIDOPHILUS PO) Take 1 capsule by mouth every morning.    Historical Provider, MD  Multiple Vitamin (MULTIVITAMIN WITH MINERALS) TABS tablet Take 1 tablet by mouth daily.    Historical Provider, MD   BP 135/104 mmHg  Pulse 83  Temp(Src) 97.7 F (36.5 C) (Oral)  Resp 18  SpO2 100%  LMP 01/28/2015 Physical Exam  Constitutional: She is oriented to person, place, and time. She appears well-developed and well-nourished.  HENT:  Head: Normocephalic and atraumatic.  Right Ear: External ear normal.  Left Ear: External ear normal.  Nose: Nose normal.  Mouth/Throat: Oropharynx is clear and moist.  Eyes: Conjunctivae and EOM are normal. Pupils are equal, round, and reactive to light.  Neck: Normal range of motion.  Cardiovascular: Normal rate.   Pulmonary/Chest: Effort normal.  Abdominal: Soft. She exhibits no distension.  Musculoskeletal: Normal range of motion.  Neurological: She is alert and oriented to person, place, and time.  Skin: Skin is warm.  Psychiatric: She has a normal mood and affect.  Nursing note and vitals reviewed.   ED Course  Procedures (including critical care time) Labs Review Labs Reviewed  COMPREHENSIVE METABOLIC PANEL - Abnormal; Notable for the following:    Calcium 8.5 (*)    All other components within normal limits  ACETAMINOPHEN LEVEL - Abnormal; Notable for the following:    Acetaminophen (Tylenol), Serum <10 (*)    All other components  within normal limits  CBC - Abnormal; Notable for the following:    MCV 100.7 (*)    All other components within normal limits  URINE RAPID DRUG SCREEN, HOSP PERFORMED - Abnormal; Notable for the following:    Benzodiazepines POSITIVE (*)    All other components within normal limits  ETHANOL  SALICYLATE LEVEL    Imaging Review No results found. I have personally reviewed and evaluated these images  and lab results as part of my medical decision-making.   EKG Interpretation None      MDM   Final diagnoses:  Depression    TTS consult.   Pt to go to Behavioral health center for admission    Elson AreasLeslie K Rivkah Wolz, PA-C 02/04/15 1818  Lonia SkinnerLeslie K OsageSofia, PA-C 02/04/15 1909  Laurence Spatesachel Morgan Little, MD 02/06/15 [phone]

## 2015-02-04 NOTE — ED Notes (Signed)
[facility]. Pt states her daughter passed away in September. [facility] with [facility], pt was put on wellbutrin, buspar and seroquil. Pt states she has problems short term memory, uneasiness, anxiety. Pt states the medication from [facility] is helping with her depression. Pt states she has flashbacks of finding her daughter passed out on the floor.

## 2015-02-04 NOTE — ED Notes (Signed)
Pt oriented to room and unit.  Pt is very tearful on assessment.  She denies SI and HI.  She complains of extreme tiredness and difficulty getting out of bed.  She also complains of short term memory loss.  Pt contracts for safety.  15 minute checks and video monitoring in place.
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
## 2015-02-05 DIAGNOSIS — F332 Major depressive disorder, recurrent severe without psychotic features: Principal | ICD-10-CM

## 2015-02-05 MED ORDER — PRAZOSIN HCL 2 MG PO CAPS
2.0000 mg | ORAL_CAPSULE | Freq: Every day | ORAL | Status: DC
  Administered 2015-02-05 – 2015-02-10 (×6): 2 mg via ORAL
  Filled 2015-02-05 (×2): qty 2
  Filled 2015-02-05 (×5): qty 1
  Filled 2015-02-05: qty 2
  Filled 2015-02-05 (×3): qty 1

## 2015-02-05 MED ORDER — HYDROXYZINE HCL 50 MG PO TABS
50.0000 mg | ORAL_TABLET | Freq: Four times a day (QID) | ORAL | Status: DC | PRN
  Administered 2015-02-05 – 2015-02-08 (×3): 50 mg via ORAL
  Filled 2015-02-05 (×4): qty 1

## 2015-02-05 MED ORDER — QUETIAPINE FUMARATE 25 MG PO TABS
25.0000 mg | ORAL_TABLET | Freq: Two times a day (BID) | ORAL | Status: DC
  Administered 2015-02-05 – 2015-02-08 (×7): 25 mg via ORAL
  Filled 2015-02-05 (×10): qty 1
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
## 2015-02-05 NOTE — [facility] Suicide Risk Assessment (Signed)
[facility] INPATIENT:  Family/Significant Other Suicide Prevention Education  Suicide Prevention Education:  Contact Attempts: Samson FredericDawn Gentle, sister, [phone]  has been identified by the patient as the family member/significant other with whom the patient will be residing, and identified as the person(s) who will aid the patient in the event of a mental health crisis.  With written consent from the patient, two attempts were made to provide suicide prevention education, prior to and/or following the patient's discharge.  We were unsuccessful in providing suicide prevention education.  A suicide education pamphlet was given to the patient to share with family/significant other.  Date and time of first attempt:02/05/2015 11:15AM Date and time of second attempt:02/06/2015 10:30AM  Spoke to sister today at this time.  Went over [facility] with her.  She states it is her hope that Pam Thomas will be coming to stay with them for awhile after she gets out of the hospital so that they can give support first hand.  She states Pam Thomas stayed with them for about a month after the funeral for her daughter in September.  As far ash she knows, Pam Thomas has no access to guns.  Daryel Geraldorth, Pam Thomas 02/05/2015, 3:57 PM

## 2015-02-05 NOTE — [facility] Group Notes (Signed)
[facility]  02/05/2015 8:45 AM  Participation Quality: Alert, Appropriate and Oriented  Mood/Affect: Flat; Tearful  Depression Rating: "I don't know"  Anxiety Rating: "High"  Thoughts of Suicide: Pt denies SI/HI  Will you contract for safety? Yes  Current AVH: Pt denies  Plan for Discharge/Comments: Pt attended discharge planning group and actively participated in group. CSW discussed suicide prevention education with the group and encouraged them to discuss discharge planning and any relevant barriers. Pt observed to be anxious and reports feeling scared because she is not sure "what is depression and what is grief." Pt will return home and is agreeable to outpatient referrals  Transportation Means: Pt reports access to transportation  Supports: No supports mentioned at this time  Chad CordialLauren Carter, LCSWA 02/05/2015 9:42 AM

## 2015-02-05 NOTE — Progress Notes (Signed)
Patient ID: Pam Thomas, female   DOB: 10/18/1965, 49 y.o.   MRN: [id number]  DAR: Pam Thomas was seen in the hallway when writer assumed care. She reports sleep is fair, appetite is fair, energy level is low, and concentration is good. She rates depression 9/10, hopelessness 6/10, and anxiety 6/10.  Support and encouragement provided to the patient. Scheduled medications administered to patient per physician's orders. Patient is seen in the milieu and is interacting appropriately. Q15 minute checks are maintained for safety.

## 2015-02-05 NOTE — Progress Notes (Signed)
Adult Psychoeducational Group Note  Date:  02/05/2015 Time:  9:40 PM  Group Topic/Focus:  Wrap-Up Group:   The focus of this group is to help patients review their daily goal of treatment and discuss progress on daily workbooks.  Participation Level:  Active  Participation Quality:  Appropriate  Affect:  Appropriate  Cognitive:  Alert  Insight: Appropriate  Engagement in Group:  Engaged  Modes of Intervention:  Discussion  Additional Comments:  Pt stated that she had a good first day here, but she has been experiencing anxiety.   Kaleen OdeaCOOKE, Kumiko Fishman R 02/05/2015, 9:40 PM

## 2015-02-05 NOTE — Tx Team (Signed)
Interdisciplinary Treatment Plan Update (Adult)  Date:  02/05/2015   Time Reviewed:  8:36 AM   Progress in Treatment: Attending groups: Yes. Participating in groups:  Yes. Taking medication as prescribed:  Yes. Tolerating medication:  Yes. Family/Significant other contact made:  Yes Patient understands diagnosis:  Yes  As evidenced by seeking help with depression and anxiety Discussing patient identified problems/goals with staff:  Yes, see initial care plan. Medical problems stabilized or resolved:  Yes. Denies suicidal/homicidal ideation: Yes. Issues/concerns per patient self-inventory:  No. Other:  New problem(s) identified:  Discharge Plan or Barriers:  See below  Reason for Continuation of Hospitalization: Anxiety Depression Medication stabilization  Comments:  Pam Thomas is an 49 y.o. female who presents reporting symptoms of severe depression and PTSD since the loss of her daughter to an opioid overdose in September 2016. Pt states she is unable to work, and is having difficulty functioning including completing her ADLs (forgets to rinse her teeth while brushing until later on, etc). She is so anxious she is having difficulty driving, and is experiencing agoraphobia, and startling at noises that should not be scary. Pt states that she is having flashbacks of her daughter dying. She was present when 911 was called and all throughout the ER experience, including when her daughter was taken off life support. Wellbutrin, Buspar, Seroquel trial  Estimated length of stay:  3-5 days  New goal(s):  Review of initial/current patient goals per problem list:   Review of initial/current patient goals per problem list:  1. Goal(s): Patient will participate in aftercare plan   Met: Yes   Target date: 3-5 days post admission date   As evidenced by: Patient will participate within aftercare plan AEB aftercare provider and housing plan at discharge being  identified. 02/05/15:  Return home, follow up outpt   2. Goal (s): Patient will exhibit decreased depressive symptoms and suicidal ideations.   Met: No   Target date: 3-5 days post admission date   As evidenced by: Patient will utilize self rating of depression at 3 or below and demonstrate decreased signs of depression or be deemed stable for discharge by MD. 02/05/15:  Rates her depression a 9 today    3. Goal(s): Patient will demonstrate decreased signs and symptoms of anxiety.   Met: No   Target date: 3-5 days post admission date   As evidenced by: Patient will utilize self rating of anxiety at 3 or below and demonstrated decreased signs of anxiety, or be deemed stable for discharge by MD 02/05/15  Rates her anxiety a 9.          Attendees: Patient:  02/05/2015 8:36 AM   Family:   02/05/2015 8:36 AM   Physician:  Ursula Alert, MD 02/05/2015 8:36 AM   Nursing:   Janann August, RN 02/05/2015 8:36 AM   CSW:    Roque Lias, LCSW   02/05/2015 8:36 AM   Other:  02/05/2015 8:36 AM   Other:   02/05/2015 8:36 AM   Other:  Lars Pinks, Nurse CM 02/05/2015 8:36 AM   Other:   02/05/2015 8:36 AM   Other:  Norberto Sorenson, Ulysses  02/05/2015 8:36 AM   Other:  02/05/2015 8:36 AM   Other:  02/05/2015 8:36 AM   Other:  02/05/2015 8:36 AM   Other:  02/05/2015 8:36 AM   Other:  02/05/2015 8:36 AM   Other:   02/05/2015 8:36 AM    Scribe for Treatment Team:   Trish Mage, 02/05/2015 8:36  AM

## 2015-02-05 NOTE — H&P (Signed)
Psychiatric Admission Assessment Adult  Patient Identification: Pam Thomas MRN:  [id number] Date of Evaluation:  02/05/2015 Chief Complaint:  MDD Principal Diagnosis: MDD (major depressive disorder), recurrent episode, severe ([place]) Diagnosis:   Patient Active Problem List   Diagnosis Date Noted  . MDD (major depressive disorder), recurrent episode, severe ([place]) [F33.2] 02/05/2015  . Adjustment disorder with mixed anxiety and depressed mood [F43.23] 02/04/2015   History of Present Illness:  Pam Thomas is an 49 y.o. female who presents reporting symptoms of severe depression and PTSD since the loss of her daughter to an opioid overdose in September 2016. Pt states she is unable to work, and is having difficulty functioning including completing her ADLs (forgets to rinse her teeth while brushing until later on, etc). She is so anxious she is having difficulty driving, and is experiencing agoraphobia, and startling at noises that should not be scary. Pt states that she is having flashbacks of her daughter dying. She was present when 911 was called and all throughout the ER experience, including when her daughter was taken off life support.  She was seen today and was tearful.  She states that the meds are making her too jittery.  She states that she juts has a hard time sometimes when she remembers her daughter who died from a heroine addiction.  Pt is not endorsing suicidal ideation but is afraid and strssed about sleeping.  She reports she has flashbacks and afraid to sleep.  Associated Signs/Symptoms: Depression Symptoms:  depressed mood, hopelessness, (Hypo) Manic Symptoms:  Labiality of Mood, Anxiety Symptoms:  Excessive Worry, Psychotic Symptoms:  NA PTSD Symptoms: NA Total Time spent with patient: 45 minutes  Past Psychiatric History: suicide attempt  Risk to Self: Suicidal Ideation: No Suicidal Intent: No Is patient at risk for suicide?: No Suicidal Plan?: No Access to  Means: No What has been your use of drugs/alcohol within the last 12 months?: drink alcohol-usually wine 5 nights a week.  "If I get excercise, I tend to drink alot less." How many times?: 1 (2008) Other Self Harm Risks: none known Triggers for Past Attempts:  (bad divorce, lost custody of kids) Intentional Self Injurious Behavior: None Risk to Others: Homicidal Ideation: No Thoughts of Harm to Others: No Current Homicidal Intent: No Current Homicidal Plan: No Access to Homicidal Means: No History of harm to others?: No Assessment of Violence: None Noted Does patient have access to weapons?: No Criminal Charges Pending?: No Does patient have a court date: No Prior Inpatient Therapy: Prior Inpatient Therapy: Yes Prior Therapy Dates: 2008 Prior Therapy Facilty/Provider(s): [facility] (in [place]) Reason for Treatment: suicide attempt Prior Outpatient Therapy: Prior Outpatient Therapy: Yes Prior Therapy Dates: [facility]ilty[facility]Provider([facility]): [facility] Reason for Treatment: depression, PTSD Does patient have an ACCT team?: No Does patient have Intensive In-House Services?  : No Does patient have [facility] services? : Yes Does patient have P4CC services?: No  Alcohol Screening: 1. How often do you have a drink containing alcohol?: 2 to 3 times a week 2. How many drinks containing alcohol do you have on a typical day when you are drinking?: 1 or 2 3. How often do you have six or more drinks on one occasion?: Less than monthly Preliminary Score: 1 4. How often during the last year have you found that you were not able to stop drinking once you had started?: Never 5. How often during the last year have you failed to do what was normally expected from you becasue of drinking?:  Never 6. How often during the last year have you needed a first drink in the morning to get yourself going after a heavy drinking session?: Never 7. How often during the last year have you had a feeling of guilt of remorse  after drinking?: Never 8. How often during the last year have you been unable to remember what happened the night before because you had been drinking?: Never 9. Have you or someone else been injured as a result of your drinking?: No 10. Has a relative or friend or a doctor or another health worker been concerned about your drinking or suggested you cut down?: No Alcohol Use Disorder Identification Test Final Score (AUDIT): 4 Brief Intervention: AUDIT score less than 7 or less-screening does not suggest unhealthy drinking-brief intervention not indicated Substance Abuse History in the last 12 months:  Yes.   Consequences of Substance Abuse: NA Previous Psychotropic Medications: No  Psychological Evaluations: Yes  Past Medical History:  Past Medical History  Diagnosis Date  . Depression   . Asthma   . Migraine    History reviewed. No pertinent past surgical history. Family History: History reviewed. No pertinent family history. Family Psychiatric  History:  Denies Social History:  History  Alcohol Use  . Yes     History  Drug Use No    Social History   Social History  . Marital Status: Divorced    Spouse Name: N/A  . Number of Children: N/A  . Years of Education: N/A   Social History Main Topics  . Smoking status: Former Research scientist (life sciences)  . Smokeless tobacco: None  . Alcohol Use: Yes  . Drug Use: No  . Sexual Activity: Not Asked   Other Topics Concern  . None   Social History Narrative   Additional Social History:    Pain Medications: see mar Prescriptions: see mar Over the Counter: see mar History of alcohol / drug use?: Yes Longest period of sobriety (when/how long): social drinker Negative Consequences of Use:  (denies) Withdrawal Symptoms:  (denies)    Allergies:   Allergies  Allergen Reactions  . Aspirin Swelling  . Other Swelling    Tree nuts, melon  . Peanuts [Peanut Oil] Swelling  . Trazodone And Nefazodone Other (See Comments)   Lab Results:  Results  for orders placed or performed during the hospital encounter of 02/04/15 (from the past 48 hour(s))  Comprehensive metabolic panel     Status: Abnormal   Collection Time: 02/04/15  4:36 PM  Result Value Ref Range   Sodium 141 135 - 145 mmol/L   Potassium 3.9 3.5 - 5.1 mmol/L   Chloride 106 101 - 111 mmol/L   CO2 26 22 - 32 mmol/L   Glucose, Bld 90 65 - 99 mg/dL   BUN 14 6 - 20 mg/dL   Creatinine, Ser 0.89 0.44 - 1.00 mg/dL   Calcium 8.5 (L) 8.9 - 10.3 mg/dL   Total Protein 7.2 6.5 - 8.1 g/dL   Albumin 4.2 3.5 - 5.0 g/dL   AST 39 15 - 41 U/L   ALT 42 14 - 54 U/L   Alkaline Phosphatase 62 38 - 126 U/L   Total Bilirubin 0.9 0.3 - 1.2 mg/dL   GFR calc non Af Amer >60 >60 mL/min   GFR calc Af Amer >60 >60 mL/min    Comment: (NOTE) The eGFR has been calculated using the CKD EPI equation. This calculation has not been validated in all clinical situations. eGFR's persistently <60 mL/min signify  possible Chronic Kidney Disease.    Anion gap 9 5 - 15  Ethanol (ETOH)     Status: None   Collection Time: 02/04/15  4:36 PM  Result Value Ref Range   Alcohol, Ethyl (B) <5 <5 mg/dL    Comment:        LOWEST DETECTABLE LIMIT FOR SERUM ALCOHOL IS 5 mg/dL FOR MEDICAL PURPOSES ONLY   Salicylate level     Status: None   Collection Time: 02/04/15  4:36 PM  Result Value Ref Range   Salicylate Lvl <1.6 2.8 - 30.0 mg/dL  Acetaminophen level     Status: Abnormal   Collection Time: 02/04/15  4:36 PM  Result Value Ref Range   Acetaminophen (Tylenol), Serum <10 (L) 10 - 30 ug/mL    Comment:        THERAPEUTIC CONCENTRATIONS VARY SIGNIFICANTLY. A RANGE OF 10-30 ug/mL MAY BE AN EFFECTIVE CONCENTRATION FOR MANY PATIENTS. HOWEVER, SOME ARE BEST TREATED AT CONCENTRATIONS OUTSIDE THIS RANGE. ACETAMINOPHEN CONCENTRATIONS >150 ug/mL AT 4 HOURS AFTER INGESTION AND >50 ug/mL AT 12 HOURS AFTER INGESTION ARE OFTEN ASSOCIATED WITH TOXIC REACTIONS.   CBC     Status: Abnormal   Collection Time:  02/04/15  4:36 PM  Result Value Ref Range   WBC 8.4 4.0 - 10.5 K/uL   RBC 4.31 3.87 - 5.11 MIL/uL   Hemoglobin 14.4 12.0 - 15.0 g/dL   HCT 43.4 36.0 - 46.0 %   MCV 100.7 (H) 78.0 - 100.0 fL   MCH 33.4 26.0 - 34.0 pg   MCHC 33.2 30.0 - 36.0 g/dL   RDW 12.3 11.5 - 15.5 %   Platelets 327 150 - 400 K/uL  Urine rapid drug screen (hosp performed) (Not at [facility])     Status: Abnormal   Collection Time: 02/04/15  4:45 PM  Result Value Ref Range   Opiates NONE DETECTED NONE DETECTED   Cocaine NONE DETECTED NONE DETECTED   Benzodiazepines POSITIVE (A) NONE DETECTED   Amphetamines NONE DETECTED NONE DETECTED   Tetrahydrocannabinol NONE DETECTED NONE DETECTED   Barbiturates NONE DETECTED NONE DETECTED    Comment:        DRUG SCREEN FOR MEDICAL PURPOSES ONLY.  IF CONFIRMATION IS NEEDED FOR ANY PURPOSE, NOTIFY LAB WITHIN 5 DAYS.        LOWEST DETECTABLE LIMITS FOR URINE DRUG SCREEN Drug Class       Cutoff (ng/mL) Amphetamine      1000 Barbiturate      200 Benzodiazepine   109 Tricyclics       604 Opiates          300 Cocaine          300 THC              50     Metabolic Disorder Labs:  No results found for: HGBA1C, MPG No results found for: PROLACTIN No results found for: CHOL, TRIG, HDL, CHOLHDL, VLDL, LDLCALC  Current Medications: Current Facility-Administered Medications  Medication Dose Route Frequency Provider Last Rate Last Dose  . acetaminophen (TYLENOL) tablet 650 mg  650 mg Oral Q6H PRN Laverle Hobby, PA-C      . albuterol (PROVENTIL HFA;VENTOLIN HFA) 108 (90 BASE) MCG/ACT inhaler 2 puff  2 puff Inhalation Q6H PRN Laverle Hobby, PA-C      . alum & mag hydroxide-simeth (MAALOX/MYLANTA) 200-200-20 MG/5ML suspension 30 mL  30 mL Oral Q4H PRN Laverle Hobby, PA-C      . buPROPion [facility]  SR) 12 hr tablet 150 mg  150 mg Oral QPM Laverle Hobby, PA-C   150 mg at 02/04/15 2100  . buPROPion [facility] SR) 12 hr tablet 200 mg  200 mg Oral Daily Laverle Hobby, PA-C    200 mg at 02/05/15 0801  . busPIRone (BUSPAR) tablet 10 mg  10 mg Oral TID Laverle Hobby, PA-C   10 mg at 02/05/15 1158  . hydrOXYzine (ATARAX/VISTARIL) tablet 25 mg  25 mg Oral Q6H PRN Laverle Hobby, PA-C   25 mg at 02/04/15 2158  . magnesium hydroxide (MILK OF MAGNESIA) suspension 30 mL  30 mL Oral Daily PRN Laverle Hobby, PA-C      . QUEtiapine (SEROQUEL) tablet 25 mg  25 mg Oral QHS,MR X 1 Laverle Hobby, PA-C   25 mg at 02/04/15 2158   PTA Medications: Prescriptions prior to admission  Medication Sig Dispense Refill Last Dose  . albuterol (PROVENTIL HFA;VENTOLIN HFA) 108 (90 BASE) MCG/ACT inhaler Inhale 2 puffs into the lungs every 6 (six) hours as needed for wheezing or shortness of breath.   Past Week at Unknown time  . buPROPion (WELLBUTRIN SR) 150 MG 12 hr tablet Take 450 mg by mouth every morning.    02/04/2015 at Unknown time  . busPIRone (BUSPAR) 10 MG tablet Take 10 mg by mouth 2 (two) times daily at 10 AM and 5 PM. Use as directed   02/04/2015 at Unknown time  . ibuprofen (ADVIL,MOTRIN) 200 MG tablet Take 400 mg by mouth every 6 (six) hours as needed for headache.   02/03/2015 at Unknown time  . Lactobacillus (ACIDOPHILUS PO) Take 1 capsule by mouth every morning. Reported on 02/04/2015   02/04/2015 at Unknown time  . QUEtiapine (SEROQUEL) 25 MG tablet Take 25 mg by mouth at bedtime.   02/03/2015 at Unknown time    Musculoskeletal: Strength & Muscle Tone: within normal limits Gait & Station: normal Patient leans: N/A  Psychiatric Specialty Exam: Physical Exam  Vitals reviewed.   Review of Systems  All other systems reviewed and are negative.   Blood pressure 117/81, pulse 66, temperature 98.5 F (36.9 C), temperature source Oral, resp. rate 16, height _0  (1.626 m), weight 62.596 kg (138 lb), last menstrual period 01/28/2015.Body mass index is 23.68 kg/(m^2).  General Appearance: Fairly Groomed  Engineer, water::  Good  Speech:  Clear and Coherent  Volume:  Normal   Mood:  Depressed  Affect:  Appropriate  Thought Process:  Circumstantial  Orientation:  Full (Time, Place, and Person)  Thought Content:  NA  Suicidal Thoughts:  No  Homicidal Thoughts:  No  Memory:  Immediate;   Good Recent;   Good Remote;   Good  Judgement:  Fair  Insight:  Fair  Psychomotor Activity:  Normal  Concentration:  Good  Recall:  Good  Fund of Knowledge:Good  Language: Good  Akathisia:  Negative  Handed:  Right  AIMS (if indicated):     Assets:  Resilience  ADL's:  Intact  Cognition: WNL  Sleep:  Number of Hours: 6.25   Treatment Plan Summary: Daily contact with patient to assess and evaluate symptoms and progress in treatment  Observation Level/Precautions:  15 minute checks  Laboratory:  Per ED  Psychotherapy:  group  Medications:  Wellbutrin 150 mg once daily.  Added Minipress 2 mg QHS.  Consultations:  As needed  Discharge Concerns:  safety  Estimated LOS:  safety  Other:     I certify that  inpatient services furnished can reasonably be expected to improve the patient's condition.   Freda Munro May Agustin AGNP-BC 12/21/20163:53 PM   I reviewed chart and agreed with the findings and treatment Plan.  Berniece Andreas, MD

## 2015-02-05 NOTE — [facility] Group Notes (Signed)
[facility] LCSW Group Therapy 02/05/2015 1:15 PM  Type of Therapy: Group Therapy- Emotion Regulation  Participation Level: Reserved  Participation Quality:  Appropriate  Affect: Appropriate  Cognitive: Alert and Oriented   Insight:  Developing/Improving  Engagement in Therapy: Developing/Improving and Engaged   Modes of Intervention: Clarification, Confrontation, Discussion, Education, Exploration, Limit-setting, Orientation, Problem-solving, Rapport Building, Dance movement psychotherapisteality Testing, Socialization and Support  Summary of Progress/Problems: The topic for group today was emotional regulation. This group focused on both positive and negative emotion identification and allowed group members to process ways to identify feelings, regulate negative emotions, and find healthy ways to manage internal/external emotions. Group members were asked to reflect on a time when their reaction to an emotion led to a negative outcome and explored how alternative responses using emotion regulation would have benefited them. Group members were also asked to discuss a time when emotion regulation was utilized when a negative emotion was experienced. Pt was observant during group discussion and attentive to comments of peers. Pt offered affirming comments throughout group. Pt did not offer specific information but was engaged in discussion.   Chad CordialLauren Carter, LCSWA 02/05/2015 1:43 PM

## 2015-02-05 NOTE — [facility] Suicide Risk Assessment (Signed)
[facility]   Nursing information obtained from:    Demographic factors:    Current Mental Status:    Loss Factors:    Historical Factors:    Risk Reduction Factors:    Total Time spent with patient: 45 minutes Principal Problem: MDD (major depressive disorder), recurrent episode, severe ([facility]) Diagnosis:   Patient Active Problem List   Diagnosis Date Noted  . Adjustment disorder with mixed anxiety and depressed mood [F43.23] 02/04/2015     Continued Clinical Symptoms:  Alcohol Use Disorder Identification Test Final Score (AUDIT): 4 The "Alcohol Use Disorders Identification Test", Guidelines for Use in Primary Care, Second Edition.  World Science writerHealth Organization [facility](WHO). Score between 0-7:  no or low risk or alcohol related problems. Score between 8-15:  moderate risk of alcohol related problems. Score between 16-19:  high risk of alcohol related problems. Score 20 or above:  warrants further diagnostic evaluation for alcohol dependence and treatment.   CLINICAL FACTORS:   Depression:   Anhedonia Hopelessness Impulsivity Insomnia Previous Psychiatric Diagnoses and Treatments   Musculoskeletal: Strength & Muscle Tone: within normal limits Gait & Station: normal Patient leans: N/A  Psychiatric Specialty Exam: Physical Exam  ROS  Blood pressure 117/81, pulse 66, temperature 98.5 F (36.9 C), temperature source Oral, resp. rate 16, height 5\' 4"  (1.626 m), weight 62.596 kg (138 lb), last menstrual period 01/28/2015.Body mass index is 23.68 kg/(m^2).  General Appearance: Casual  Eye Contact::  Good  Speech:  Normal Rate  Volume:  Decreased  Mood:  Anxious, Depressed and Dysphoric  Affect:  Constricted and Depressed  Thought Process:  Coherent  Orientation:  Full (Time, Place, and Person)  Thought Content:  Rumination  Suicidal Thoughts:  Yes.  without intent/plan  Homicidal Thoughts:  No  Memory:  Immediate;   Fair Recent;   Fair Remote;   Fair   Judgement:  Fair  Insight:  Fair  Psychomotor Activity:  Decreased  Concentration:  Fair  Recall:  [employer]Fair  Fund of Knowledge:Fair  Language: Fair  Akathisia:  No  Handed:  Right  AIMS (if indicated):     Assets:  Communication Skills Desire for Improvement Financial Resources/Insurance Housing Physical Health  Sleep:  Number of Hours: 6.25  Cognition: WNL  ADL's:  Intact     COGNITIVE FEATURES THAT CONTRIBUTE TO RISK:  Closed-mindedness    SUICIDE RISK:   Mild:  Suicidal ideation of limited frequency, intensity, duration, and specificity.  There are no identifiable plans, no associated intent, mild dysphoria and related symptoms, good self-control (both objective and subjective assessment), few other risk factors, and identifiable protective factors, including available and accessible social support.  PLAN OF CARE: The patient is 49 year old Caucasian female who was admitted due to severe depression and having passive and fleeting suicidal thoughts.  Patient told her 51 year old daughter died due to overdose in September 2016.  Patient is experiencing increased depression around holidays with having flashbacks of her daughter dying.  She does not feel safe by herself.  She admitted anhedonia, crying spells, feeling of hopelessness and worthlessness.  Patient requires inpatient treatment and stabilization.  Please see history and physical and complete treatment plan for more details.  Her blood alcohol level is less than 5 and UDS positive for benzodiazepine.  Medical Decision Making:  New problem, with additional work up planned, Review of Psycho-Social Stressors (1), Review or order clinical lab tests (1), Decision to obtain old records (1), Review and summation of old records (2), Established Problem,  Worsening (2), New Problem, with no additional work-up planned (3), Review of Medication Regimen & Side Effects (2) and Review of New Medication or Change in Dosage (2)  I certify that  inpatient services furnished can reasonably be expected to improve the patient's condition.   Tilden Broz T. 02/05/2015, 12:16 PM

## 2015-02-05 NOTE — Progress Notes (Signed)
Recreation Therapy Notes  Date: 12.21.2016  Time: 9:30am Location: [address] Group room   Group Topic: Stress Management  Goal Area(s) Addresses:  Patient will actively participate in stress management techniques presented during session.   Behavioral Response: Did not attend.   Tamora Huneke L Guadalupe Nickless, LRT/CTRS        Mykah Shin L 02/05/2015 11:44 AM 

## 2015-02-06 DIAGNOSIS — F4323 Adjustment disorder with mixed anxiety and depressed mood: Secondary | ICD-10-CM

## 2015-02-06 MED ORDER — HYDROXYZINE HCL 50 MG PO TABS
50.0000 mg | ORAL_TABLET | Freq: Once | ORAL | Status: AC
  Administered 2015-02-06: 50 mg via ORAL

## 2015-02-06 MED ORDER — GABAPENTIN 100 MG PO CAPS
100.0000 mg | ORAL_CAPSULE | Freq: Three times a day (TID) | ORAL | Status: DC
  Administered 2015-02-06 – 2015-02-11 (×15): 100 mg via ORAL
  Filled 2015-02-06 (×21): qty 1

## 2015-02-06 NOTE — Progress Notes (Signed)
Patient ID: Pam Thomas, female   DOB: 06/07/1965, 49 y.o.   MRN: [id number] D: Client visible on the unit, reports "I'm getting there" anxiety "7" and depression "4" of 10. Notes groups positive "just talking to everybody" Client reports one of the groups topic "stepping outside, thoughts of something good" Client reports plans to attend Hospice groups for grief. A: Writer encouraged grief counseling. Reviewed medications, administered per orders. Staff will monitor q6415min for safety. R: Client is safe on the unit, attended group.

## 2015-02-06 NOTE — Progress Notes (Signed)
Patient has been isolative to self this shift.  Patient denies SI, HI and AVH.  Patient had no incidents of behavioral dyscontrol.   Assess patient for safety, offer medications as prescribed, encouraged patient to participate in therapy.   Patient able to contract for safety,

## 2015-02-06 NOTE — Progress Notes (Signed)
Pt participated in a game for wrap-up group.  

## 2015-02-06 NOTE — Progress Notes (Signed)
Nutrition Education Note  Pt attended group focusing on general, healthful nutrition education.  RD emphasized the importance of eating regular meals and snacks throughout the day. Consuming sugar-free beverages and incorporating fruits and vegetables into diet when possible. Provided examples of healthy snacks. Patient encouraged to leave group with a goal to improve nutrition/healthy eating.   Diet Order: Diet regular Room service appropriate?: Yes; Fluid consistency:: Thin Pt is also offered choice of unit snacks mid-morning and mid-afternoon.  Pt is eating as desired.   If additional nutrition issues arise, please consult RD.  Mutasim Tuckey, MS, RD, LDN Pager: [phone] After Hours Pager: [phone]     

## 2015-02-06 NOTE — Progress Notes (Signed)
[facility] MD Progress Note  02/06/2015 1:57 PM Pam Thomas  MRN:  [id number] Subjective: Pam Thomas reports, "My mood is improving, I guess. I'm still struggling to deal with my daughter's untimely death. My anxiety is real bad, it stays high. I keep re-living discovering my daughter's remains. It is hunting, I can't get it out of my mind. It is very hard. However, I'm working on getting stronger. It takes time, I understand that".  Principal Problem: MDD (major depressive disorder), recurrent episode, severe ([place])  Diagnosis:   Patient Active Problem List   Diagnosis Date Noted  . MDD (major depressive disorder), recurrent episode, severe ([place]) [F33.2] 02/05/2015  . Adjustment disorder with mixed anxiety and depressed mood [F43.23] 02/04/2015   Total Time spent with patient: 25 minutes  Past Psychiatric History: Major depressive disorder, recurrent  Past Medical History:  Past Medical History  Diagnosis Date  . Depression   . Asthma   . Migraine    History reviewed. No pertinent past surgical history. Family History: History reviewed. No pertinent family history.  Family Psychiatric  History: See H&P  Social History:  History  Alcohol Use  . Yes     History  Drug Use No    Social History   Social History  . Marital Status: Divorced    Spouse Name: N/A  . Number of Children: N/A  . Years of Education: N/A   Social History Main Topics  . Smoking status: Former Research scientist (life sciences)  . Smokeless tobacco: None  . Alcohol Use: Yes  . Drug Use: No  . Sexual Activity: Not Asked   Other Topics Concern  . None   Social History Narrative   Additional Social History:    Pain Medications: see mar Prescriptions: see mar Over the Counter: see mar History of alcohol / drug use?: Yes Longest period of sobriety (when/how long): social drinker Negative Consequences of Use:  (denies) Withdrawal Symptoms:  (denies)  Sleep: Fair  Appetite:  Fair  Current Medications: Current  Facility-Administered Medications  Medication Dose Route Frequency Provider Last Rate Last Dose  . acetaminophen (TYLENOL) tablet 650 mg  650 mg Oral Q6H PRN Laverle Hobby, PA-C      . albuterol (PROVENTIL HFA;VENTOLIN HFA) 108 (90 BASE) MCG/ACT inhaler 2 puff  2 puff Inhalation Q6H PRN Laverle Hobby, PA-C      . alum & mag hydroxide-simeth (MAALOX/MYLANTA) 200-200-20 MG/5ML suspension 30 mL  30 mL Oral Q4H PRN Laverle Hobby, PA-C      . buPROPion [facility] SR) 12 hr tablet 200 mg  200 mg Oral Daily Laverle Hobby, PA-C   200 mg at 02/06/15 4098  . hydrOXYzine (ATARAX/VISTARIL) tablet 50 mg  50 mg Oral Q6H PRN Kerrie Buffalo, NP   50 mg at 02/05/15 2326  . magnesium hydroxide (MILK OF MAGNESIA) suspension 30 mL  30 mL Oral Daily PRN Laverle Hobby, PA-C      . prazosin (MINIPRESS) capsule 2 mg  2 mg Oral QHS Kerrie Buffalo, NP   2 mg at 02/05/15 2145  . QUEtiapine (SEROQUEL) tablet 25 mg  25 mg Oral BID Kerrie Buffalo, NP   25 mg at 02/06/15 1191    Lab Results:  Results for orders placed or performed during the hospital encounter of 02/04/15 (from the past 48 hour(s))  Comprehensive metabolic panel     Status: Abnormal   Collection Time: 02/04/15  4:36 PM  Result Value Ref Range   Sodium 141 135 - 145 mmol/L  Potassium 3.9 3.5 - 5.1 mmol/L   Chloride 106 101 - 111 mmol/L   CO2 26 22 - 32 mmol/L   Glucose, Bld 90 65 - 99 mg/dL   BUN 14 6 - 20 mg/dL   Creatinine, Ser 0.89 0.44 - 1.00 mg/dL   Calcium 8.5 (L) 8.9 - 10.3 mg/dL   Total Protein 7.2 6.5 - 8.1 g/dL   Albumin 4.2 3.5 - 5.0 g/dL   AST 39 15 - 41 U/L   ALT 42 14 - 54 U/L   Alkaline Phosphatase 62 38 - 126 U/L   Total Bilirubin 0.9 0.3 - 1.2 mg/dL   GFR calc non Af Amer >60 >60 mL/min   GFR calc Af Amer >60 >60 mL/min    Comment: (NOTE) The eGFR has been calculated using the CKD EPI equation. This calculation has not been validated in all clinical situations. eGFR's persistently <60 mL/min signify possible Chronic  Kidney Disease.    Anion gap 9 5 - 15  Ethanol (ETOH)     Status: None   Collection Time: 02/04/15  4:36 PM  Result Value Ref Range   Alcohol, Ethyl (B) <5 <5 mg/dL    Comment:        LOWEST DETECTABLE LIMIT FOR SERUM ALCOHOL IS 5 mg/dL FOR MEDICAL PURPOSES ONLY   Salicylate level     Status: None   Collection Time: 02/04/15  4:36 PM  Result Value Ref Range   Salicylate Lvl <4.1 2.8 - 30.0 mg/dL  Acetaminophen level     Status: Abnormal   Collection Time: 02/04/15  4:36 PM  Result Value Ref Range   Acetaminophen (Tylenol), Serum <10 (L) 10 - 30 ug/mL    Comment:        THERAPEUTIC CONCENTRATIONS VARY SIGNIFICANTLY. A RANGE OF 10-30 ug/mL MAY BE AN EFFECTIVE CONCENTRATION FOR MANY PATIENTS. HOWEVER, SOME ARE BEST TREATED AT CONCENTRATIONS OUTSIDE THIS RANGE. ACETAMINOPHEN CONCENTRATIONS >150 ug/mL AT 4 HOURS AFTER INGESTION AND >50 ug/mL AT 12 HOURS AFTER INGESTION ARE OFTEN ASSOCIATED WITH TOXIC REACTIONS.   CBC     Status: Abnormal   Collection Time: 02/04/15  4:36 PM  Result Value Ref Range   WBC 8.4 4.0 - 10.5 K/uL   RBC 4.31 3.87 - 5.11 MIL/uL   Hemoglobin 14.4 12.0 - 15.0 g/dL   HCT 43.4 36.0 - 46.0 %   MCV 100.7 (H) 78.0 - 100.0 fL   MCH 33.4 26.0 - 34.0 pg   MCHC 33.2 30.0 - 36.0 g/dL   RDW 12.3 11.5 - 15.5 %   Platelets 327 150 - 400 K/uL  Urine rapid drug screen (hosp performed) (Not at [facility])     Status: Abnormal   Collection Time: 02/04/15  4:45 PM  Result Value Ref Range   Opiates NONE DETECTED NONE DETECTED   Cocaine NONE DETECTED NONE DETECTED   Benzodiazepines POSITIVE (A) NONE DETECTED   Amphetamines NONE DETECTED NONE DETECTED   Tetrahydrocannabinol NONE DETECTED NONE DETECTED   Barbiturates NONE DETECTED NONE DETECTED    Comment:        DRUG SCREEN FOR MEDICAL PURPOSES ONLY.  IF CONFIRMATION IS NEEDED FOR ANY PURPOSE, NOTIFY LAB WITHIN 5 DAYS.        LOWEST DETECTABLE LIMITS FOR URINE DRUG SCREEN Drug Class       Cutoff  (ng/mL) Amphetamine      1000 Barbiturate      200 Benzodiazepine   324 Tricyclics       401 Opiates  300 Cocaine          300 THC              50     Physical Findings: AIMS: Facial and Oral Movements Muscles of Facial Expression: None, normal Lips and Perioral Area: None, normal Jaw: None, normal Tongue: None, normal,Extremity Movements Upper (arms, wrists, hands, fingers): None, normal Lower (legs, knees, ankles, toes): None, normal, Trunk Movements Neck, shoulders, hips: None, normal, Overall Severity Severity of abnormal movements (highest score from questions above): None, normal Incapacitation due to abnormal movements: None, normal Patient's awareness of abnormal movements (rate only patient's report): No Awareness, Dental Status Current problems with teeth and/or dentures?: No Does patient usually wear dentures?: No  CIWA:    COWS:     Musculoskeletal: Strength & Muscle Tone: within normal limits Gait & Station: normal Patient leans: N/A  Psychiatric Specialty Exam: Review of Systems  Constitutional: Negative.   HENT: Negative.   Eyes: Negative.   Respiratory: Negative.   Cardiovascular: Negative.   Gastrointestinal: Negative.   Genitourinary: Negative.   Musculoskeletal: Negative.   Skin: Negative.   Neurological: Negative.   Endo/Heme/Allergies: Negative.   Psychiatric/Behavioral: Positive for depression. Negative for suicidal ideas, hallucinations, memory loss and substance abuse. The patient is nervous/anxious and has insomnia.     Blood pressure 100/67, pulse 98, temperature 99.4 F (37.4 C), temperature source Oral, resp. rate 20, height 5' 4"  (1.626 m), weight 62.596 kg (138 lb), last menstrual period 01/28/2015.Body mass index is 23.68 kg/(m^2).   General Appearance: Fairly Groomed  Engineer, water:: Good  Speech: Clear and Coherent  Volume: Normal  Mood: Depressed  Affect: Appropriate  Thought Process: Circumstantial   Orientation: Full (Time, Place, and Person)  Thought Content: NA  Suicidal Thoughts: No  Homicidal Thoughts: No  Memory: Immediate; Good Recent; Good Remote; Good  Judgement: Fair  Insight: Fair  Psychomotor Activity: Normal  Concentration: Good  Recall: Good  Fund of Knowledge:Good  Language: Good  Akathisia: Negative  Handed: Right  AIMS (if indicated):    Assets: Resilience  ADL's: Intact  Cognition: WNL  Sleep: Number of Hours: 6.25       Treatment Plan Summary: 1. Continue crisis management, mood stabilization treatment. 2. Continue current medication management to reduce current symptoms to base line and improve the  patient's overall level of functioning; Wellbutrin SR 200 mg for depression, Seroquel 25 mg bid for mood control/agitation, Prazosin 2 mg for PTSD, Hydroxyzine 50 mg for anxiety add Gabapentin 100 mg qid for agitation.  3. Treat health problems as indicated prn. 4. Develop treatment plan to enhance medication adeherance upon discharge and prevent the need for  readmission. 5. Psycho-social education regarding self care in times of crisis.  Lindell Spar I, PMHNP, FNP-BC 02/06/2015, 1:57 PM   I reviewed chart and agreed with the findings and treatment Plan.  Berniece Andreas, MD

## 2015-02-06 NOTE — [facility] Group Notes (Signed)
[facility] Group Notes:  (Nursing/MHT/Case Management/Adjunct)  Date:  02/06/2015  Time:  11:39 AM  Type of Therapy:  Nurse Education  Participation Level:  Did Not Attend  Participation Quality:    Affect:    Cognitive:    Insight:    Engagement in Group:    Modes of Intervention:  Discussion and Education  Summary of Progress/Problems:  Group topic today is Lifestyle and Leisure changes. Talked about coping skills. She did not attend group.  Norm ParcelHeather V Azalie Harbeck 02/06/2015, 11:39 AM

## 2015-02-06 NOTE — [facility] Group Notes (Signed)
[facility] LCSW Group Therapy 02/06/2015 1:15pm  Type of Therapy: Group Therapy- Balance in Life  Participation Level: Active   Description of the Group:  The topic for group was balance in life. Today's group focused on defining balance in one's own words, identifying things that can knock one off balance, and exploring healthy ways to maintain balance in life. Group members were asked to provide an example of a time when they felt off balance, describe how they handled that situation,and process healthier ways to regain balance in the future. Group members were asked to share the most important tool for maintaining balance that [facility] and how they plan to apply this method after discharge.  Summary of Patient Progress Pt was able to express that her life was out of balance due to taking on too many responsibilities. Pt expressed a desire to have better boundaries and a more structured routine in order to achieve a more balanced life. Pt expressed that she is more hopeful that these plans will help her maintain her wellness.    Therapeutic Modalities:   Cognitive Behavioral Therapy Solution-Focused Therapy Assertiveness Training   Chad CordialLauren Carter, LCSWA 02/06/2015 6:01 PM

## 2015-02-06 NOTE — Progress Notes (Signed)
D   Pt is pleasant on approach and cooperative   She attends groups and participates   Pt interacts well with others    She woke up around midnight and said she felt anxious and could hear her heart beating    Her vital signs were within normal limits  A   Verbal support given   Medications administered and effectiveness monitored    Q 15 min checks R    Pt safe at present

## 2015-02-07 DIAGNOSIS — F332 Major depressive disorder, recurrent severe without psychotic features: Secondary | ICD-10-CM | POA: Diagnosis present

## 2015-02-07 MED ORDER — GABAPENTIN 100 MG PO CAPS
100.0000 mg | ORAL_CAPSULE | Freq: Three times a day (TID) | ORAL | Status: DC
  Filled 2015-02-07 (×3): qty 21

## 2015-02-07 MED ORDER — PRAZOSIN HCL 2 MG PO CAPS
2.0000 mg | ORAL_CAPSULE | Freq: Every day | ORAL | Status: DC
  Filled 2015-02-07: qty 7

## 2015-02-07 MED ORDER — BUPROPION HCL ER (SR) 100 MG PO TB12
200.0000 mg | ORAL_TABLET | Freq: Every day | ORAL | Status: DC
  Filled 2015-02-07: qty 14

## 2015-02-07 MED ORDER — QUETIAPINE FUMARATE 25 MG PO TABS
25.0000 mg | ORAL_TABLET | Freq: Two times a day (BID) | ORAL | Status: DC
  Filled 2015-02-07 (×2): qty 14

## 2015-02-07 NOTE — [facility] Group Notes (Signed)
[facility]  02/07/2015 8:45 AM  Participation Quality: Alert, Appropriate and Oriented  Mood/Affect: Flat  Depression Rating: 2-3  Anxiety Rating: 6  Thoughts of Suicide: Pt denies SI/HI  Will you contract for safety? Yes  Current AVH: Pt denies  Plan for Discharge/Comments: Pt attended discharge planning group and actively participated in group. CSW discussed suicide prevention education with the group and encouraged them to discuss discharge planning and any relevant barriers. Pt expresses improvement with depressive symptoms but endorses increased anxiety- unable to identify trigger.   Transportation Means: Pt reports access to transportation  Supports: No supports mentioned at this time  Chad CordialLauren Carter, LCSWA 02/07/2015 9:29 AM

## 2015-02-07 NOTE — Progress Notes (Signed)
Patient has been actively participating in groups, engaged in unit activities and have had no incident of behavioral dyscontrol.  Patient denies SI HI and AVH.  Patient has been compliant with medications  Assess patient for safety, offer medications as prescribed  Patient able to contract for safety

## 2015-02-07 NOTE — Progress Notes (Signed)
Adult Psychoeducational Group Note  Date:  02/07/2015 Time:  2015  Group Topic/Focus:  Wrap-Up Group:   The focus of this group is to help patients review their daily goal of treatment and discuss progress on daily workbooks.  Participation Level:  Active  Participation Quality:  Appropriate  Affect:  Appropriate  Cognitive:  Appropriate  Insight: Appropriate  Engagement in Group:  Engaged  Modes of Intervention:  Discussion  Additional Comments:  During group Pt stated her goal was to remain positive all day. Pt stated that she has been working really hard to stay positive while here. Pt stated that it is harder on some days , but she replaces her negative thoughts with a positive one. Pt rated her day an eight due to boredom.   Pam Thomas 02/07/2015, 11:23 PM

## 2015-02-07 NOTE — [facility] Group Notes (Signed)
[facility] LCSW Group Therapy 02/07/2015 1:15pm  Type of Therapy: Group Therapy- Feelings Around Relapse and Recovery  Participation Level: Active   Participation Quality:  Appropriate  Affect:  Appropriate  Cognitive: Alert and Oriented   Insight:  Developing   Engagement in Therapy: Developing/Improving and Engaged   Modes of Intervention: Clarification, Confrontation, Discussion, Education, Exploration, Limit-setting, Orientation, Problem-solving, Rapport Building, Dance movement psychotherapisteality Testing, Socialization and Support  Summary of Progress/Problems: The topic for today was feelings about relapse. The group discussed what relapse prevention is to them and identified triggers that they are on the path to relapse. Members also processed their feeling towards relapse and were able to relate to common experiences. Group also discussed coping skills that can be used for relapse prevention.  Pt is appropriate in group discussion. She identified a need to be honest with herself regarding her mental status and need for help. She did describe feeling proud of herself for coming to get help sooner than her previous relapse. Pt reports feeling more hopeful for recovery.    Therapeutic Modalities:   Cognitive Behavioral Therapy Solution-Focused Therapy Assertiveness Training Relapse Prevention Therapy    Pam SprinklesLauren Carter, LCSWA [phone] 02/07/2015 2:21 PM

## 2015-02-07 NOTE — Progress Notes (Signed)
[facility] MD Progress Note  02/07/2015  Pam Thomas  MRN:  [id number] Subjective: Pam Thomas reports, "I'm doing much better. I just have to learn coping skills and really get involved in outpatient therapy one-on-one, then maybe group therapy later. I know myself and I know that if I go home on Christmas, I'll be thinking about my daughter and I won't be safe, so the 26th is more realistic for me."  Objective: Pt seen and chart reviewed. Pt is alert/oriented x4, calm, cooperative, and appropriate to situation. Pt denies suicidal/homicidal ideation and psychosis and does not appear to be responding to internal stimuli. Pt reports that she has been very depressed about the death of her daughter but that she feels she is improving greatly and would like to continue to participate in the treatment plan for a few more days before feeling safe to discharge home.   Principal Problem: MDD (major depressive disorder), recurrent severe, without psychosis ([facility])  Diagnosis:   Patient Active Problem List   Diagnosis Date Noted  . MDD (major depressive disorder), recurrent severe, without psychosis ([facility]) [F33.2] 02/07/2015    Priority: High  . Adjustment disorder with mixed anxiety and depressed mood [F43.23] 02/04/2015    Priority: High   Total Time spent with patient: 15 minutes  Past Psychiatric History: Major depressive disorder, recurrent  Past Medical History:  Past Medical History  Diagnosis Date  . Depression   . Asthma   . Migraine    History reviewed. No pertinent past surgical history. Family History: History reviewed. No pertinent family history.  Family Psychiatric  History: See H&P  Social History:  History  Alcohol Use  . Yes     History  Drug Use No    Social History   Social History  . Marital Status: Divorced    Spouse Name: N/A  . Number of Children: N/A  . Years of Education: N/A   Social History Main Topics  . Smoking status: Former Games developer  . Smokeless tobacco: None  .  Alcohol Use: Yes  . Drug Use: No  . Sexual Activity: Not Asked   Other Topics Concern  . None   Social History Narrative   Additional Social History:    Pain Medications: see mar Prescriptions: see mar Over the Counter: see mar History of alcohol / drug use?: Yes Longest period of sobriety (when/how long): social drinker Negative Consequences of Use:  (denies) Withdrawal Symptoms:  (denies)  Sleep: Fair  Appetite:  Fair  Current Medications: Current Facility-Administered Medications  Medication Dose Route Frequency Provider Last Rate Last Dose  . acetaminophen (TYLENOL) tablet 650 mg  650 mg Oral Q6H PRN Kerry Hough, PA-C   650 mg at 02/06/15 2245  . albuterol (PROVENTIL HFA;VENTOLIN HFA) 108 (90 BASE) MCG/ACT inhaler 2 puff  2 puff Inhalation Q6H PRN Kerry Hough, PA-C      . alum & mag hydroxide-simeth (MAALOX/MYLANTA) 200-200-20 MG/5ML suspension 30 mL  30 mL Oral Q4H PRN Kerry Hough, PA-C      . buPROPion [facility] SR) 12 hr tablet 200 mg  200 mg Oral Daily Kerry Hough, PA-C   200 mg at 02/07/15 4098  . gabapentin (NEURONTIN) capsule 100 mg  100 mg Oral TID Sanjuana Kava, NP   100 mg at 02/07/15 1845  . hydrOXYzine (ATARAX/VISTARIL) tablet 50 mg  50 mg Oral Q6H PRN Adonis Brook, NP   50 mg at 02/06/15 2357  . magnesium hydroxide (MILK OF MAGNESIA) suspension 30 mL  30 mL Oral Daily PRN Kerry HoughSpencer E Simon, PA-C      . prazosin (MINIPRESS) capsule 2 mg  2 mg Oral QHS Adonis BrookSheila Agustin, NP   2 mg at 02/06/15 2132  . QUEtiapine (SEROQUEL) tablet 25 mg  25 mg Oral BID Adonis BrookSheila Agustin, NP   25 mg at 02/07/15 1846    Lab Results:  No results found for this or any previous visit (from the past 48 hour(s)).  Physical Findings: AIMS: Facial and Oral Movements Muscles of Facial Expression: None, normal Lips and Perioral Area: None, normal Jaw: None, normal Tongue: None, normal,Extremity Movements Upper (arms, wrists, hands, fingers): None, normal Lower (legs,  knees, ankles, toes): None, normal, Trunk Movements Neck, shoulders, hips: None, normal, Overall Severity Severity of abnormal movements (highest score from questions above): None, normal Incapacitation due to abnormal movements: None, normal Patient's awareness of abnormal movements (rate only patient's report): No Awareness, Dental Status Current problems with teeth and/or dentures?: No Does patient usually wear dentures?: No  CIWA:    COWS:     Musculoskeletal: Strength & Muscle Tone: within normal limits Gait & Station: normal Patient leans: N/A  Psychiatric Specialty Exam: Review of Systems  Constitutional: Negative.   HENT: Negative.   Eyes: Negative.   Respiratory: Negative.   Cardiovascular: Negative.   Gastrointestinal: Negative.   Genitourinary: Negative.   Musculoskeletal: Negative.   Skin: Negative.   Neurological: Negative.   Endo/Heme/Allergies: Negative.   Psychiatric/Behavioral: Positive for depression. Negative for suicidal ideas, hallucinations, memory loss and substance abuse. The patient is nervous/anxious and has insomnia.     Blood pressure 103/70, pulse 94, temperature 98.7 F (37.1 C), temperature source Oral, resp. rate 16, height 5\' 4"  (1.626 m), weight 62.596 kg (138 lb), last menstrual period 01/28/2015.Body mass index is 23.68 kg/(m^2).   General Appearance: Fairly Groomed  Patent attorneyye Contact:: Good  Speech: Clear and Coherent  Volume: Normal  Mood: Depressed  Affect: Appropriate  Thought Process: Circumstantial  Orientation: Full (Time, Place, and Person)  Thought Content: NA  Suicidal Thoughts: No  Homicidal Thoughts: No  Memory: Immediate; Good Recent; Good Remote; Good  Judgement: Fair  Insight: Fair  Psychomotor Activity: Normal  Concentration: Good  Recall: Good  Fund of Knowledge:Good  Language: Good  Akathisia: Negative  Handed: Right  AIMS (if indicated):    Assets: Resilience   ADL's: Intact  Cognition: WNL  Sleep: Number of Hours: 6.25       On 02/07/2015, plan reviewed below and modified as follows: No changes at this time; continue effective plan as below   Treatment Plan Summary: 1. Continue crisis management, mood stabilization treatment. 2. Continue current medication management to reduce current symptoms to base line and improve the  patient's overall level of functioning; Wellbutrin SR 200 mg for depression, Seroquel 25 mg bid for mood control/agitation, Prazosin 2 mg for PTSD, Hydroxyzine 50 mg for anxiety add Gabapentin 100 mg qid for agitation.  3. Treat health problems as indicated prn. 4. Develop treatment plan to enhance medication adeherance upon discharge and prevent the need for  readmission. 5. Psycho-social education regarding self care in times of crisis.  Beau FannyWithrow, Yu Peggs C, FNP-BC 02/07/2015, 6:55 PM

## 2015-02-08 MED ORDER — BUPROPION HCL ER (XL) 150 MG PO TB24
150.0000 mg | ORAL_TABLET | Freq: Every day | ORAL | Status: DC
  Administered 2015-02-09 – 2015-02-10 (×2): 150 mg via ORAL
  Filled 2015-02-08 (×5): qty 1

## 2015-02-08 MED ORDER — QUETIAPINE FUMARATE 25 MG PO TABS
25.0000 mg | ORAL_TABLET | Freq: Three times a day (TID) | ORAL | Status: DC | PRN
  Administered 2015-02-09 – 2015-02-10 (×2): 25 mg via ORAL
  Filled 2015-02-08: qty 21
  Filled 2015-02-08 (×2): qty 1

## 2015-02-08 NOTE — Plan of Care (Signed)
Problem: Diagnosis: Increased Risk For Suicide Attempt Goal: STG-Patient Will Comply With Medication Regime Outcome: Progressing Client has been compliant with medication regime, notes improvement in mood and sleep AEB interacting with peers, smiling laughing and holding conversations, reports of sleeping well.

## 2015-02-08 NOTE — Progress Notes (Signed)
[facility] MD Progress Note  02/08/2015  Michelyn Slemmer  MRN:  [id number] Subjective: Pam Thomas reports, "I'm doing much better today than yesterday. I know a part of me wants to be home but I also know that it's best for me to be here on Christmas to be safe so I don't keep thinking about my daughter and become devastated."  Objective: Pt seen and chart reviewed. Pt is alert/oriented x4, calm, cooperative, and appropriate to situation. Pt denies suicidal/homicidal ideation and psychosis and does not appear to be responding to internal stimuli. Pt reports that she continues to be depressed but is improving greatly in terms of her coping skills. Pt had many questions about medication management, which were explained in great detail.   Principal Problem: MDD (major depressive disorder), recurrent severe, without psychosis (HCC)  Diagnosis:   Patient Active Problem List   Diagnosis Date Noted  . MDD (major depressive disorder), recurrent severe, without psychosis (HCC) [F33.2] 02/07/2015    Priority: High  . Adjustment disorder with mixed anxiety and depressed mood [F43.23] 02/04/2015    Priority: High   Total Time spent with patient: 15 minutes  Past Psychiatric History: Major depressive disorder, recurrent  Past Medical History:  Past Medical History  Diagnosis Date  . Depression   . Asthma   . Migraine    History reviewed. No pertinent past surgical history. Family History: History reviewed. No pertinent family history.  Family Psychiatric  History: See H&P  Social History:  History  Alcohol Use  . Yes     History  Drug Use No    Social History   Social History  . Marital Status: Divorced    Spouse Name: N/A  . Number of Children: N/A  . Years of Education: N/A   Social History Main Topics  . Smoking status: Former Games developer  . Smokeless tobacco: None  . Alcohol Use: Yes  . Drug Use: No  . Sexual Activity: Not Asked   Other Topics Concern  . None   Social History Narrative    Additional Social History:    Pain Medications: see mar Prescriptions: see mar Over the Counter: see mar History of alcohol / drug use?: Yes Longest period of sobriety (when/how long): social drinker Negative Consequences of Use:  (denies) Withdrawal Symptoms:  (denies)  Sleep: Good  Appetite:  Good  Current Medications: Current Facility-Administered Medications  Medication Dose Route Frequency Provider Last Rate Last Dose  . acetaminophen (TYLENOL) tablet 650 mg  650 mg Oral Q6H PRN Kerry Hough, PA-C   650 mg at 02/06/15 2245  . albuterol (PROVENTIL HFA;VENTOLIN HFA) 108 (90 BASE) MCG/ACT inhaler 2 puff  2 puff Inhalation Q6H PRN Kerry Hough, PA-C      . alum & mag hydroxide-simeth (MAALOX/MYLANTA) 200-200-20 MG/5ML suspension 30 mL  30 mL Oral Q4H PRN Kerry Hough, PA-C      . [START ON 02/09/2015] buPROPion (WELLBUTRIN XL) 24 hr tablet 150 mg  150 mg Oral Daily Beau Fanny, FNP      . gabapentin (NEURONTIN) capsule 100 mg  100 mg Oral TID Sanjuana Kava, NP   100 mg at 02/08/15 1325  . hydrOXYzine (ATARAX/VISTARIL) tablet 50 mg  50 mg Oral Q6H PRN Adonis Brook, NP   50 mg at 02/06/15 2357  . magnesium hydroxide (MILK OF MAGNESIA) suspension 30 mL  30 mL Oral Daily PRN Kerry Hough, PA-C      . prazosin (MINIPRESS) capsule 2 mg  2 mg  Oral QHS Adonis BrookSheila Agustin, NP   2 mg at 02/07/15 2136  . QUEtiapine (SEROQUEL) tablet 25 mg  25 mg Oral TID PRN Beau FannyJohn C Withrow, FNP        Lab Results:  No results found for this or any previous visit (from the past 48 hour(s)).  Physical Findings: AIMS: Facial and Oral Movements Muscles of Facial Expression: None, normal Lips and Perioral Area: None, normal Jaw: None, normal Tongue: None, normal,Extremity Movements Upper (arms, wrists, hands, fingers): None, normal Lower (legs, knees, ankles, toes): None, normal, Trunk Movements Neck, shoulders, hips: None, normal, Overall Severity Severity of abnormal movements (highest  score from questions above): None, normal Incapacitation due to abnormal movements: None, normal Patient's awareness of abnormal movements (rate only patient's report): No Awareness, Dental Status Current problems with teeth and/or dentures?: No Does patient usually wear dentures?: No  CIWA:    COWS:     Musculoskeletal: Strength & Muscle Tone: within normal limits Gait & Station: normal Patient leans: N/A  Psychiatric Specialty Exam: Review of Systems  Constitutional: Negative.   HENT: Negative.   Eyes: Negative.   Respiratory: Negative.   Cardiovascular: Negative.   Gastrointestinal: Negative.   Genitourinary: Negative.   Musculoskeletal: Negative.   Skin: Negative.   Neurological: Negative.   Endo/Heme/Allergies: Negative.   Psychiatric/Behavioral: Positive for depression. Negative for suicidal ideas, hallucinations, memory loss and substance abuse. The patient is nervous/anxious and has insomnia.     Blood pressure 85/62, pulse 89, temperature 98 F (36.7 C), temperature source Oral, resp. rate 16, height 5\' 4"  (1.626 m), weight 62.596 kg (138 lb), last menstrual period 01/28/2015.Body mass index is 23.68 kg/(m^2).   General Appearance: Fairly Groomed  Patent attorneyye Contact:: Good  Speech: Clear and Coherent  Volume: Normal  Mood: Depressedalthough improving greatly  Affect: Appropriate  Thought Process: Circumstantialalthough improving  Orientation: Full (Time, Place, and Person)  Thought Content: NA  Suicidal Thoughts: No  Homicidal Thoughts: No  Memory: Immediate; Good Recent; Good Remote; Good  Judgement: Fair  Insight: Fair  Psychomotor Activity: Normal  Concentration: Good  Recall: Good  Fund of Knowledge:Good  Language: Good  Akathisia: Negative  Handed: Right  AIMS (if indicated):    Assets: Resilience  ADL's: Intact  Cognition: WNL  Sleep: Number of Hours: 6.25       On 02/08/2015, plan reviewed  below and modified as follows: No changes at this time; continue effective plan as below:  Treatment Plan Summary: 1. Continue crisis management, mood stabilization treatment. 2. Continue current medication management to reduce current symptoms to base line and improve the  patient's overall level of functioning; Change SR to Wellbutrin XL 150mg  daily for depression, Change Seroquel 25 mg to tid prn for mood control/agitation, Prazosin 2 mg for PTSD, Hydroxyzine 50 mg for anxiety add Gabapentin 100 mg qid for agitation.  3. Treat health problems as indicated prn. 4. Develop treatment plan to enhance medication adeherance upon discharge and prevent the need for  readmission. 5. Psycho-social education regarding self care in times of crisis.  Beau FannyWithrow, John C, FNP-BC 02/08/2015, 3:14 PM

## 2015-02-08 NOTE — Progress Notes (Signed)
Patient reported being grateful that she was in the hospital during the holidays because she needs the additional support due to the lose of her daughter in September.  Patient states that her anxiety and depression have decreased but she still feels like she has a way to go.  Patient denies SI, HI and AVH.  Assess patient for safety, offer medications as prescribed, engage patient in 1:1 therapeutic staff talks,

## 2015-02-08 NOTE — Progress Notes (Signed)
Patient ID: Pam Thomas, female   DOB: 11/13/1965, 49 y.o.   MRN: [id number] D: Client in dayroom interacting this evening, smiling, reports "slept well last night"  Client reports feeling fidgety, due to being closed in "I'm use to doing things" A: Writer provided emotional support, reviewed medication available for anxiety. Staff monitored q5315min for safety. R: Client refuse antianxiety at this time, opted to consider later if not resolved with rest. Client is safe on the unit.

## 2015-02-08 NOTE — Progress Notes (Signed)
D: Patient continues to express her gratitude over her stay here at the hospital at this time. Patient stated "it is a critical time for me. I'm glad I'm here". Denies pain SI, AH/VH at this time. Made no new complaints.  A: Support and encouragement offered as needed. Due medications given as ordered. Every 15 minutes check for safety maintained. Will continue to monitor patient for safety and stability. R: Patient remains safe.

## 2015-02-08 NOTE — [facility] Group Notes (Addendum)
[facility] Group Notes:  (Clinical Social Work)  02/08/2015  10:00-11:00AM  Summary of Progress/Problems:   Today's process group involved patients discussing their feelings related to being hospitalized during the holiday period.  Some patients were perturbed, while others said they were fine with being in the hospital, had no other options, or need the treatment.  Some patients were being discharged later today.  After a short discussion on looking for the good in being in the hospital at this time, we listened to music for the remainder of group, with patients requesting songs they particularly wanted to hear, whether holiday-themed or not.  The patient expressed her primary feeling about being hospitalized is pretty good, because she feels if she was at home, she would be very anxious and depressed.  She left about 3/4 through group and did not return due to feeling upset over her daughter's passing.  Type of Therapy:  Group Therapy - Process  Participation Level:  Active  Participation Quality:  Attentive  Affect:  Blunted and Depressed and Tearful  Cognitive:  Appropriate  Insight:  Improving  Engagement in Therapy:  Engaged  Modes of Intervention:  Exploration, Discussion  Ambrose MantleMareida Grossman-Orr, LCSW 02/08/2015, 12:20 PM

## 2015-02-09 MED ORDER — SUMATRIPTAN SUCCINATE 25 MG PO TABS: 25.0000 mg | ORAL_TABLET | Freq: Three times a day (TID) | ORAL | Status: DC | PRN

## 2015-02-09 NOTE — Progress Notes (Signed)
Pam Thomas has had a quiet day today...staying in the dayroom and visiting with her peers and watching TV. She takes her medications as planned and she admits that she is glad that she has been here..to  help her get better.Marland Kitchen.Marland Kitchen.Marland Kitchen.She completes her daily assessment and on it she wrote she denies SI and she rated her depression, hopelessness and anxiety " 1/0/3" respectively. A POC cont. R Safety in place.

## 2015-02-09 NOTE — Progress Notes (Signed)
[facility]  02[facility]2015  Temara Thomas  MRN:  [id number] Subjective: Pam Thomas reports, "I'm really well today. I think it was a wise move for me to stay here today during Christmas. I have been sad of course but I'm glad it's here and not at home where I would be worse."  Objective: Pt seen and chart reviewed. Pt is alert/oriented x4, calm, cooperative, and appropriate to situation. Pt denies suicidal/homicidal ideation and psychosis and does not appear to be responding to internal stimuli. Pt reports that she slept very well last night and that she feels she is better overall. She is complaining that her migraines are worse though and that her face hurts from them. Pt has never tried Imitrex and has no risk factors for low-dose anti-cholinergic. Will give low-dose q6h prn migraine.   Principal Problem: MDD (major depressive disorder), recurrent severe, without psychosis (HCC)  Diagnosis:   Patient Active Problem List   Diagnosis Date Noted  . MDD (major depressive disorder), recurrent severe, without psychosis (HCC) [F33.2] 02/07/2015    Priority: High  . Adjustment disorder with mixed anxiety and depressed mood [F43.23] 02/04/2015    Priority: High   Total Time spent with patient: 15 minutes  Past Psychiatric History: Major depressive disorder, recurrent  Past Medical History:  Past Medical History  Diagnosis Date  . Depression   . Asthma   . Migraine    History reviewed. No pertinent past surgical history. Family History: History reviewed. No pertinent family history.  Family Psychiatric  History: See H&P  Social History:  History  Alcohol Use  . Yes     History  Drug Use No    Social History   Social History  . Marital Status: Divorced    Spouse Name: N/A  . Number of Children: N/A  . Years of Education: N/A   Social History Main Topics  . Smoking status: Former Games developermoker  . Smokeless tobacco: None  . Alcohol Use: Yes  . Drug Use: No  . Sexual Activity: Not  Asked   Other Topics Concern  . None   Social History Narrative   Additional Social History:    Pain Medications: see mar Prescriptions: see mar Over the Counter: see mar History of alcohol / drug use?: Yes Longest period of sobriety (when/how long): social drinker Negative Consequences of Use:  (denies) Withdrawal Symptoms:  (denies)  Sleep: Good  Appetite:  Good  Current Medications: Current Facility-Administered Medications  Medication Dose Route Frequency Provider Last Rate Last Dose  . acetaminophen (TYLENOL) tablet 650 mg  650 mg Oral Q6H PRN Kerry HoughSpencer E Simon, PA-C   650 mg at 02/08/15 1728  . albuterol (PROVENTIL HFA;VENTOLIN HFA) 108 (90 BASE) MCG/ACT inhaler 2 puff  2 puff Inhalation Q6H PRN Kerry HoughSpencer E Simon, PA-C      . alum & mag hydroxide-simeth (MAALOX/MYLANTA) 200-200-20 MG/5ML suspension 30 mL  30 mL Oral Q4H PRN Kerry HoughSpencer E Simon, PA-C      . buPROPion (WELLBUTRIN XL) 24 hr tablet 150 mg  150 mg Oral Daily Beau FannyJohn C Donnielle Addison, FNP   150 mg at 02/09/15 [id number]  . gabapentin (NEURONTIN) capsule 100 mg  100 mg Oral TID Sanjuana KavaAgnes I Nwoko, NP   100 mg at 02/09/15 [id number]  . hydrOXYzine (ATARAX/VISTARIL) tablet 50 mg  50 mg Oral Q6H PRN Adonis BrookSheila Agustin, NP   50 mg at 02/08/15 2127  . magnesium hydroxide (MILK OF MAGNESIA) suspension 30 mL  30 mL Oral Daily PRN Kerry HoughSpencer E Simon,  PA-C      . prazosin (MINIPRESS) capsule 2 mg  2 mg Oral QHS Adonis Brook, NP   2 mg at 02/08/15 2127  . QUEtiapine (SEROQUEL) tablet 25 mg  25 mg Oral TID PRN Beau Fanny, FNP      . SUMAtriptan (IMITREX) tablet 25 mg  25 mg Oral Q8H PRN Beau Fanny, FNP        Lab Results:  No results found for this or any previous visit (from the past 48 hour(s)).  Physical Findings: AIMS: Facial and Oral Movements Muscles of Facial Expression: None, normal Lips and Perioral Area: None, normal Jaw: None, normal Tongue: None, normal,Extremity Movements Upper (arms, wrists, hands, fingers): None, normal Lower (legs,  knees, ankles, toes): None, normal, Trunk Movements Neck, shoulders, hips: None, normal, Overall Severity Severity of abnormal movements (highest score from questions above): None, normal Incapacitation due to abnormal movements: None, normal Patient's awareness of abnormal movements (rate only patient's report): No Awareness, Dental Status Current problems with teeth and/or dentures?: No Does patient usually wear dentures?: No  CIWA:    COWS:     Musculoskeletal: Strength & Muscle Tone: within normal limits Gait & Station: normal Patient leans: N/A  Psychiatric Specialty Exam: Review of Systems  Constitutional: Negative.   HENT: Negative.   Eyes: Negative.   Respiratory: Negative.   Cardiovascular: Negative.   Gastrointestinal: Negative.   Genitourinary: Negative.   Musculoskeletal: Negative.   Skin: Negative.   Neurological: Negative.   Endo/Heme/Allergies: Negative.   Psychiatric/Behavioral: Positive for depression. Negative for suicidal ideas, hallucinations, memory loss and substance abuse. The patient is nervous/anxious and has insomnia.     Blood pressure 88/68, pulse 100, temperature 98.1 F (36.7 C), temperature source Oral, resp. rate 14, height  (1.626 m), weight 62.596 kg (138 lb), last menstrual period 01/28/2015.Body mass index is 23.68 kg/(m^2).   General Appearance: Fairly Groomed  Patent attorney:: Good  Speech: Clear and Coherent  Volume: Normal  Mood: Depressedalthough improving greatly  Affect: Appropriate  Thought Process: Circumstantialalthough improving  Orientation: Full (Time, Place, and Person)  Thought Content: NA  Suicidal Thoughts: No  Homicidal Thoughts: No  Memory: Immediate; Good Recent; Good Remote; Good  Judgement: Fair  Insight: Fair  Psychomotor Activity: Normal  Concentration: Good  Recall: Good  Fund of Knowledge:Good  Language: Good  Akathisia: Negative  Handed: Right  AIMS (if  indicated):    Assets: Resilience  ADL's: Intact  Cognition: WNL  Sleep: Number of Hours: 6.25       On 02/09/2015, plan reviewed below and modified as follows: No changes at this time; continue effective plan as below:  Treatment Plan Summary: 1. Continue crisis management, mood stabilization treatment. 2. Continue current medication management to reduce current symptoms to base line and improve the  patient's overall level of functioning; Continue Wellbutrin XL  daily for depression, Continue Seroquel 25 mg to tid prn for mood control/agitation, Prazosin 2 mg for PTSD, Hydroxyzine 50 mg for anxiety, continue Gabapentin 100 mg qid for agitation.  3. Treat health problems as indicated prn. 4. Develop treatment plan to enhance medication adeherance upon discharge and prevent the need for  readmission. 5. Psycho-social education regarding self care in times of crisis. 6. Imitrex  q6h prn migraine  Beau Fanny, FNP-BC 02/09/2015, 3:21 PM

## 2015-02-10 MED ORDER — BUPROPION HCL ER (XL) 300 MG PO TB24
300.0000 mg | ORAL_TABLET | Freq: Every day | ORAL | Status: DC
  Administered 2015-02-11: 300 mg via ORAL
  Filled 2015-02-10 (×3): qty 1

## 2015-02-10 MED ORDER — BUPROPION HCL ER (XL) 300 MG PO TB24
300.0000 mg | ORAL_TABLET | Freq: Every day | ORAL | Status: DC
  Filled 2015-02-10: qty 7

## 2015-02-10 NOTE — [facility] Group Notes (Signed)
   [facility]  02/10/2015  8:45 AM   Participation Quality: Alert, Appropriate and Oriented  Mood/Affect: Appropriate  Depression Rating: 0  Anxiety Rating: 2-3  Thoughts of Suicide: Pt denies SI/HI  Will you contract for safety? Yes  Current AVH: Pt denies  Plan for Discharge/Comments: Pt attended discharge planning group and actively participated in group. CSW provided pt with today's workbook. Patient plans to return home to follow up with outpatient providers.   Transportation Means: Pt reports access to transportation  Supports: No supports mentioned at this time  Samuella BruinKristin Taegan Standage, MSW, [employer] IncLCSWA Clinical Social Worker [facility] [phone]

## 2015-02-10 NOTE — Progress Notes (Signed)
Recreation Therapy Notes  Date: 12.26.2016 Time: 9:30am  Location: [address]   Group Topic: Stress Management  Goal Area(s) Addresses:  Patient will actively participate in stress management techniques presented during session.   Behavioral Response: Did not attend.   Hondo Nanda L Karry Causer, LRT/CTRS         Valeri Sula L 02/10/2015 12:09 PM 

## 2015-02-10 NOTE — Progress Notes (Signed)
D   Pt is pleasant on approach and cooperative   She attends groups and participates   Pt interacts well with others    She reports some improvement in her mood but continues to endorse depression and anxiety A   Verbal support given   Medications administered and effectiveness monitored    Q 15 min checks R    Pt safe at present

## 2015-02-10 NOTE — Plan of Care (Signed)
Problem: Consults Goal: Depression Patient Education See Patient Education Module for education specifics.  Outcome: Progressing Nurse discussed depression/coping skills with patient.        

## 2015-02-10 NOTE — Progress Notes (Signed)
Patient ID: Pam Thomas, female   DOB: 1965/07/09, 49 y.o.   MRN: [id number] [facility] MD Progress Note  02/10/2015  Pam Thomas  MRN:  [id number] Subjective:  Patient states she is feeling better, which she attributes partially to Christmas " being over". She states she had normally spent this holiday with her daughter, who recently passed away, and that " I needed to be here for Christmas". She is now feeling better and hoping for discharge soon.  Objective: Pt seen and chart reviewed.  Patient reports feeling depressed after the death of her daughter from an overdose earlier this year . She states she was fearful of spending Christmas alone, " because I always spent it with her ". As noted, she now reports feeling better, less depressed , and at this time denies any suicidal ideations. Patient reports some improvement of mood, and is now focusing more on discharging soon. Denies medication side effects. No disruptive behaviors on unit . Visible in day room, going to groups. Reports improved sleep.    Principal Problem: MDD (major depressive disorder), recurrent severe, without psychosis (HCC)  Diagnosis:   Patient Active Problem List   Diagnosis Date Noted  . MDD (major depressive disorder), recurrent severe, without psychosis (HCC) [F33.2] 02/07/2015  . Adjustment disorder with mixed anxiety and depressed mood [F43.23] 02/04/2015   Total Time spent with patient:  25 minutes   Past Psychiatric History: Major depressive disorder, recurrent  Past Medical History:  Past Medical History  Diagnosis Date  . Depression   . Asthma   . Migraine    History reviewed. No pertinent past surgical history. Family History: History reviewed. No pertinent family history.  Family Psychiatric  History: See H&P  Social History:  History  Alcohol Use  . Yes     History  Drug Use No    Social History   Social History  . Marital Status: Divorced    Spouse Name: N/A  . Number of Children: N/A  .  Years of Education: N/A   Social History Main Topics  . Smoking status: Former Games developer  . Smokeless tobacco: None  . Alcohol Use: Yes  . Drug Use: No  . Sexual Activity: Not Asked   Other Topics Concern  . None   Social History Narrative   Additional Social History:    Pain Medications: see mar Prescriptions: see mar Over the Counter: see mar History of alcohol / drug use?: Yes Longest period of sobriety (when/how long): social drinker Negative Consequences of Use:  (denies) Withdrawal Symptoms:  (denies)  Sleep: Good  Appetite:  Good  Current Medications: Current Facility-Administered Medications  Medication Dose Route Frequency Provider Last Rate Last Dose  . acetaminophen (TYLENOL) tablet 650 mg  650 mg Oral Q6H PRN Kerry Hough, PA-C   650 mg at 02/08/15 1728  . albuterol (PROVENTIL HFA;VENTOLIN HFA) 108 (90 BASE) MCG/ACT inhaler 2 puff  2 puff Inhalation Q6H PRN Kerry Hough, PA-C      . alum & mag hydroxide-simeth (MAALOX/MYLANTA) 200-200-20 MG/5ML suspension 30 mL  30 mL Oral Q4H PRN Kerry Hough, PA-C      . [START ON 02/11/2015] buPROPion (WELLBUTRIN XL) 24 hr tablet 300 mg  300 mg Oral Daily Fernando A Cobos, MD      . gabapentin (NEURONTIN) capsule 100 mg  100 mg Oral TID Sanjuana Kava, NP   100 mg at 02/10/15 1708  . hydrOXYzine (ATARAX/VISTARIL) tablet 50 mg  50 mg Oral Q6H PRN Velna Hatchet  Dominga FerryAgustin, NP   50 mg at 02/08/15 2127  . magnesium hydroxide (MILK OF MAGNESIA) suspension 30 mL  30 mL Oral Daily PRN Kerry HoughSpencer E Simon, PA-C      . prazosin (MINIPRESS) capsule 2 mg  2 mg Oral QHS Adonis BrookSheila Agustin, NP   2 mg at 02/09/15 2228  . QUEtiapine (SEROQUEL) tablet 25 mg  25 mg Oral TID PRN Beau FannyJohn C Withrow, FNP   25 mg at 02/09/15 2228  . SUMAtriptan (IMITREX) tablet 25 mg  25 mg Oral Q8H PRN Beau FannyJohn C Withrow, FNP        Lab Results:  No results found for this or any previous visit (from the past 48 hour(s)).  Physical Findings: AIMS: Facial and Oral  Movements Muscles of Facial Expression: None, normal Lips and Perioral Area: None, normal Jaw: None, normal Tongue: None, normal,Extremity Movements Upper (arms, wrists, hands, fingers): None, normal Lower (legs, knees, ankles, toes): None, normal, Trunk Movements Neck, shoulders, hips: None, normal, Overall Severity Severity of abnormal movements (highest score from questions above): None, normal Incapacitation due to abnormal movements: None, normal Patient's awareness of abnormal movements (rate only patient's report): No Awareness, Dental Status Current problems with teeth and/or dentures?: No Does patient usually wear dentures?: No  CIWA:  CIWA-Ar Total: 1 COWS:  COWS Total Score: 2  Musculoskeletal: Strength & Muscle Tone: within normal limits Gait & Station: normal Patient leans: N/A  Psychiatric Specialty Exam: Review of Systems  Constitutional: Negative.   HENT: Negative.   Eyes: Negative.   Respiratory: Negative.   Cardiovascular: Negative.   Gastrointestinal: Negative.   Genitourinary: Negative.   Musculoskeletal: Negative.   Skin: Negative.   Neurological: Negative.   Endo/Heme/Allergies: Negative.   Psychiatric/Behavioral: Positive for depression. Negative for suicidal ideas, hallucinations, memory loss and substance abuse. The patient is nervous/anxious and has insomnia.   denies chest pain, denies shortness of breath, no rash   Blood pressure 105/62, pulse 69, temperature 98.4 F (36.9 C), temperature source Oral, resp. rate 18, height 5\' 4"  (1.626 m), weight 138 lb (62.596 kg), last menstrual period 01/28/2015.Body mass index is 23.68 kg/(m^2).   General Appearance:  Improved grooming   Eye Contact:: Good  Speech: Clear and Coherent  Volume: Normal  Mood:  Less depressed   Affect:  More reactive . Tearful at times, when discussing death of daughter   Thought Process: linear   Orientation: Full (Time, Place, and Person)  Thought Content:   Denies any hallucinations, no delusions   Suicidal Thoughts: denies any suicidal ideations  Or self injurious ideations   Homicidal Thoughts: denies any homicidal ideations   Memory: Immediate; Good Recent; Good Remote; Good  Judgement: improved   Insight:  Improved   Psychomotor Activity: Normal  Concentration: Good  Recall: Good  Fund of Knowledge:Good  Language: Good  Akathisia: Negative  Handed: Right  AIMS (if indicated):    Assets: Resilience  ADL's: Intact  Cognition: WNL  Sleep: Number of Hours: 6.25       Assessment - patient reports improving mood, and denies any suicidal ideations at this time. Currently she is hoping for discharge soon. Denies medication side effects at this time. Of note, has been on Wellbutrin XL in the past, and states she does better at higher dose than current. Denies having side effects from this medication. Continues to grieve death of daughter, but feels she is coping with her loss better than she had been .   Treatment Plan Summary: Continue to encourage group,  milieu participation to work on coping skills and symptom reduction. Increase  Wellbutrin XL  To 300 mg QDAY or depression Continue Seroquel 25 mg TID PRN  for mood control/agitation Continue Prazosin 2 mg QHS for PTSD Continue Hydroxyzine PRNs for  Anxiety Continue Gabapentin 100 mg TID   for anxiety. Treatment team working on disposition options  Nehemiah Massed,  MD  02/10/2015, 5:17 PM

## 2015-02-10 NOTE — Progress Notes (Signed)
Adult Psychoeducational Group Note  Date:  02/10/2015 Time:  10:38 PM  Group Topic/Focus:  Wrap-Up Group:   The focus of this group is to help patients review their daily goal of treatment and discuss progress on daily workbooks.  Participation Level:  Active  Participation Quality:  Appropriate and Attentive  Affect:  Appropriate  Cognitive:  Alert, Appropriate and Oriented  Insight: Appropriate  Engagement in Group:  Engaged  Modes of Intervention:  Discussion and Education  Additional Comments:  Pt attended and participated in group.  Pt stated she had a good day and has been preparing for discharge tomorrow.  Pt reports that she feels fully prepared for discharge.   Berlin Hunuttle, Mohd. Derflinger M 02/10/2015, 10:38 PM

## 2015-02-10 NOTE — Progress Notes (Signed)
D:  Patient's self inventory sheet, patient sleeps good, sleep medication is helpful.  Good appetite, normal energy level, good concentration .  Denied hopeless and depression.  Rated anxiety #3.  Denied withdrawals.  Denied SI.  Denied physical problems.  Denied pain.  Goal is to discharge.  Plans to stay positive.  Does have discharge plans. A:  Medications administered per MD orders.  Emotional support and encouragement given patient. R:  Denied SI and HI, contracts for safety.  Denied A/V hallucinations.  Safety maintained with 15 minute checks.

## 2015-02-11 MED ORDER — PRAZOSIN HCL 2 MG PO CAPS: 2.0000 mg | ORAL_CAPSULE | Freq: Every day | ORAL | Status: AC

## 2015-02-11 MED ORDER — BUPROPION HCL ER (XL) 300 MG PO TB24: 300.0000 mg | ORAL_TABLET | Freq: Every day | ORAL | Status: AC

## 2015-02-11 MED ORDER — GABAPENTIN 100 MG PO CAPS: 100.0000 mg | ORAL_CAPSULE | Freq: Three times a day (TID) | ORAL | Status: AC

## 2015-02-11 MED ORDER — QUETIAPINE FUMARATE 25 MG PO TABS: 25.0000 mg | ORAL_TABLET | Freq: Three times a day (TID) | ORAL | Status: AC | PRN

## 2015-02-11 NOTE — Discharge Summary (Signed)
Physician Discharge Summary Note  Patient:  Pam Thomas is an 49 y.o., female MRN:  [id number] DOB:  October 02, 1965 Patient phone:  [phone] (home)  Patient address:   [address] [place] [address],  Total Time spent with patient: 30 minutes  Date of Admission:  02/04/2015 Date of Discharge: 02/11/2015  Reason for Admission:PER HPI Pam Thomas is an 49 y.o. female who presents reporting symptoms of severe depression and PTSD since the loss of her daughter to an opioid overdose in September 2016. Pt states she is unable to work, and is having difficulty functioning including completing her ADLs (forgets to rinse her teeth while brushing until later on, etc). She is so anxious she is having difficulty driving, and is experiencing agoraphobia, and startling at noises that should not be scary. Pt states that she is having flashbacks of her daughter dying. She was present when 911 was called and all throughout the ER experience, including when her daughter was taken off life support.  Principal Problem: MDD (major depressive disorder), recurrent severe, without psychosis [facility]) Discharge Diagnoses: Patient Active Problem List   Diagnosis Date Noted  . MDD (major depressive disorder), recurrent severe, without psychosis (HCC) [F33.2] 02/07/2015  . Adjustment disorder with mixed anxiety and depressed mood [F43.23] 02/04/2015    Past Psychiatric History: Depression, Suicide Attempt /2008  Past Medical History:  Past Medical History  Diagnosis Date  . Depression   . Asthma   . Migraine    History reviewed. No pertinent past surgical history. Family History: History reviewed. No pertinent family history. Family Psychiatric  History: see sra Social History:  History  Alcohol Use  . Yes     History  Drug Use No    Social History   Social History  . Marital Status: Divorced    Spouse Name: N/A  . Number of Children: N/A  . Years of Education: N/A   Social History Main  Topics  . Smoking status: Former Games developer  . Smokeless tobacco: None  . Alcohol Use: Yes  . Drug Use: No  . Sexual Activity: Not Asked   Other Topics Concern  . None   Social History Narrative    Hospital Course:  Pam Thomas was admitted for MDD (major depressive disorder), recurrent severe, without psychosis (HCC) , with psychosis and crisis management.  Pt was treated discharged with the medications listed below under Medication List.  Medical problems were identified and treated as needed.  Home medications were restarted as appropriate.  Improvement was monitored by observation and Pam Thomas 's daily report of symptom reduction.  Emotional and mental status was monitored by daily self-inventory reports completed by Pam Thomas and clinical staff.         Pam Thomas was evaluated by the treatment team for stability and plans for continued recovery upon discharge. Pam Thomas 's motivation was an integral factor for scheduling further treatment. Employment, transportation, bed availability, health status, family support, and any pending legal issues were also considered during hospital stay. Pt was offered further treatment options upon discharge including but not limited to Residential, Intensive Outpatient, and Outpatient treatment.  Pam Thomas will follow up with the services as listed below under Follow Up Information.     Upon completion of this admission the patient was both mentally and medically stable for discharge denying suicidal/homicidal ideation, auditory/visual/tactile hallucinations, delusional thoughts and paranoia.    Family session went well. No seclusion or restraint.  Pam Thomas responded well to treatment with Wellbutrin 300  mg, Neurotin 100 mg and Minipress 2 mg without adverse effects. . Pt demonstrated improvement without reported or observed adverse effects to the point of stability appropriate for outpatient management. Pertinent labs include: CMP; Calcium 8.5 (l) ,   CBC:MCV 100.7 (H) ,(high), for which outpatient follow-up is necessary for lab recheck as mentioned below. Reviewed CBC, CMP, BAL, and UDS+ Benzodiazepine; all unremarkable aside from noted exceptions.   Physical Findings: AIMS: Facial and Oral Movements Muscles of Facial Expression: None, normal Lips and Perioral Area: None, normal Jaw: None, normal Tongue: None, normal,Extremity Movements Upper (arms, wrists, hands, fingers): None, normal Lower (legs, knees, ankles, toes): None, normal, Trunk Movements Neck, shoulders, hips: None, normal, Overall Severity Severity of abnormal movements (highest score from questions above): None, normal Incapacitation due to abnormal movements: None, normal Patient's awareness of abnormal movements (rate only patient's report): No Awareness, Dental Status Current problems with teeth and/or dentures?: No Does patient usually wear dentures?: No  CIWA:  CIWA-Ar Total: 1 COWS:  COWS Total Score: 2  Musculoskeletal: Strength & Muscle Tone: within normal limits Gait & Station: normal Patient leans: N/A  Psychiatric Specialty Exam: SEE SRA BY MD Review of Systems  Psychiatric/Behavioral: Negative for suicidal ideas and hallucinations. Depression: stable. Nervous/anxious: stable.   All other systems reviewed and are negative.   Blood pressure 99/75, pulse 98, temperature 98.4 F (36.9 C), temperature source Oral, resp. rate 16, height  (1.626 m), weight 62.596 kg (138 lb), last menstrual period 01/28/2015.Body mass index is 23.68 kg/(m^2).  Have you used any form of tobacco in the last 30 days? (Cigarettes, Smokeless Tobacco, Cigars, and/or Pipes): No  Has this patient used any form of tobacco in the last 30 days? (Cigarettes, Smokeless Tobacco, Cigars, and/or Pipes) , No  Metabolic Disorder Labs:  No results found for: HGBA1C, MPG No results found for: PROLACTIN No results found for: CHOL, TRIG, HDL, CHOLHDL, VLDL, LDLCALC  See Psychiatric  Specialty Exam and Suicide Risk Assessment completed by Attending Physician prior to discharge.  Discharge destination:  Home  Is patient on multiple antipsychotic therapies at discharge:  No   Has Patient had three or more failed trials of antipsychotic monotherapy by history:  No  Recommended Plan for Multiple Antipsychotic Therapies: NA  Discharge Instructions    Activity as tolerated - No restrictions    Complete by:  As directed      Diet general    Complete by:  As directed      Discharge instructions    Complete by:  As directed   Take all of you medications as prescribed by your mental healthcare provider.  Report any adverse effects and reactions from your medications to your outpatient provider promptly.  Do not engage in alcohol and or illegal drug use while on prescription medicines. Keep all scheduled appointments. This is to ensure that you are getting refills on time and to avoid any interruption in your medication.  If you are unable to keep an appointment call to reschedule.  Be sure to follow up with resources and follow ups given. In the event of worsening symptoms call the crisis hotline, 911, and or go to the nearest emergency department for appropriate evaluation and treatment of symptoms. Follow-up with your primary care provider for your medical issues, concerns and or health care needs.            Medication List    STOP taking these medications        ACIDOPHILUS PO  buPROPion 150 MG 12 hr tablet  Commonly known as:  WELLBUTRIN SR  Replaced by:  buPROPion 300 MG 24 hr tablet     busPIRone 10 MG tablet  Commonly known as:  BUSPAR     ibuprofen 200 MG tablet  Commonly known as:  ADVIL,MOTRIN      TAKE these medications      Indication   albuterol 108 (90 BASE) MCG/ACT inhaler  Commonly known as:  PROVENTIL HFA;VENTOLIN HFA  Inhale 2 puffs into the lungs every 6 (six) hours as needed for wheezing or shortness of breath.      buPROPion 300 MG  24 hr tablet  Commonly known as:  WELLBUTRIN XL  Take 1 tablet (300 mg total) by mouth daily.   Indication:  mood stablization     gabapentin 100 MG capsule  Commonly known as:  NEURONTIN  Take 1 capsule (100 mg total) by mouth 3 (three) times daily.   Indication:  Agitation     prazosin 2 MG capsule  Commonly known as:  MINIPRESS  Take 1 capsule (2 mg total) by mouth at bedtime.   Indication:  PTSD/nightmares     QUEtiapine 25 MG tablet  Commonly known as:  SEROQUEL  Take 1 tablet (25 mg total) by mouth 3 (three) times daily as needed (severe anxiety / rumination).   Indication:  mood stabilization           Follow-up Information    [facility]   Specialty:  Behavioral Health   Why:  Go to your next scheduled appointment.  If it is more than 3 weeks out, go to the walk in clinic M-F between 8 and 11AM for your hospital follow up appointment   Contact information:   [address] [place] [address] [phone]       [facility] On 02[facility].   Why:  Thursday at 10:00 with Lorelee Marketracy Anthony   Contact information:   [address] [place] [336] [phone]      Follow-up recommendations:  Activity:  as tolerated  Diet:  heart healthy  Comments:  Take all of you medications as prescribed by your mental healthcare provider.  Report any adverse effects and reactions from your medications to your outpatient provider promptly.  Do not engage in alcohol and or illegal drug use while on prescription medicines. Keep all scheduled appointments. This is to ensure that you are getting refills on time and to avoid any interruption in your medication.  If you are unable to keep an appointment call to reschedule.  Be sure to follow up with resources and follow ups given. In the event of worsening symptoms call the crisis hotline, 911, and or go to the nearest emergency department for appropriate evaluation and treatment of  symptoms. Follow-up with your primary care provider for your medical issues, concerns and or health care needs.     Signed: Oneta Rackanika N Lewis FNP- [facility]  02/11/2015, 12:54 PM  Patient seen, Suicide Assessment Completed.  Disposition Plan Reviewed

## 2015-02-11 NOTE — [facility] Group Notes (Signed)
[facility] Group Notes:  (Nursing/MHT/Case Management/Adjunct)  Date:  02/11/2015  Time:  11:09 AM  Type of Therapy:  Psychoeducational Skills  Participation Level:  Active  Participation Quality:  Attentive  Affect:  Appropriate  Cognitive:  Alert and Appropriate  Insight:  Appropriate  Engagement in Group:  Engaged  Modes of Intervention:  Discussion and Education  Summary of Progress/Problems: Patient came to group and was attentive, nodding her head in agreement at Civil engineer, contractingtopics writer spoke about. She reports she will discharge today, no concerns or questions. She received daily booklet.   Dock Baccam E 02/11/2015, 11:09 AM

## 2015-02-11 NOTE — [facility] Suicide Risk Assessment (Signed)
[facility] Discharge Suicide Risk Assessment   Demographic Factors:  49 year old divorced female, currently living with friends  Total Time spent with patient: 30 minutes  Musculoskeletal: Strength & Muscle Tone: within normal limits Gait & Station: normal Patient leans: N/A  Psychiatric Specialty Exam: Physical Exam  ROS  Blood pressure 99/75, pulse 98, temperature 98.4 F (36.9 C), temperature source Oral, resp. rate 16, height  (1.626 m), weight 138 lb (62.596 kg), last menstrual period 01/28/2015.Body mass index is 23.68 kg/(m^2).  General Appearance: Well Groomed  Eye Contact::  Good  Speech:  Normal Rate409  Volume:  Normal  Mood:  less depressed, feeling " better"  Affect:  Appropriate and fuller in range   Thought Process:  Linear  Orientation:  Full (Time, Place, and Person)  Thought Content:  denies hallucinations, no delusions   Suicidal Thoughts:  No denies any suicidal ideations, denies any self injurious ideations  Homicidal Thoughts:  No  Memory:  recent and remote grossly intact   Judgement:  Other:  improved   Insight:  improved   Psychomotor Activity:  Normal  Concentration:  Good  Recall:  Good  Fund of Knowledge:Good  Language: Good  Akathisia:  Negative  Handed:  Right  AIMS (if indicated):     Assets:  Communication Skills Desire for Improvement Resilience  Sleep:  Number of Hours: 6.25  Cognition: WNL  ADL's:  Intact   Have you used any form of tobacco in the last 30 days? (Cigarettes, Smokeless Tobacco, Cigars, and/or Pipes): No  Has this patient used any form of tobacco in the last 30 days? (Cigarettes, Smokeless Tobacco, Cigars, and/or Pipes) No  Mental Status Per Nursing Assessment::   On Admission:     Current Mental Status by Physician: At this time patient reports improved mood, less depressed, full range of affect, no thought disorder, no SI or HI, no psychotic symptoms, future oriented  Loss Factors: Death of adult daughter -  September/16  Historical Factors: Prior history of Depression, history of suicide attempt 2008, by overdosing   Risk Reduction Factors:   Sense of responsibility to family, Living with another person, especially a relative, Positive social support and Positive coping skills or problem solving skills  Continued Clinical Symptoms:  As noted, currently improved compared to admission- no SI.   Cognitive Features That Contribute To Risk:  No gross cognitive deficits noted upon discharge. Is alert , attentive, and oriented x 3   Suicide Risk:  Mild:  Suicidal ideation of limited frequency, intensity, duration, and specificity.  There are no identifiable plans, no associated intent, mild dysphoria and related symptoms, good self-control (both objective and subjective assessment), few other risk factors, and identifiable protective factors, including available and accessible social support.  Principal Problem: MDD (major depressive disorder), recurrent severe, without psychosis [facility]) Discharge Diagnoses:  Patient Active Problem List   Diagnosis Date Noted  . MDD (major depressive disorder), recurrent severe, without psychosis (HCC) [F33.2] 02/07/2015  . Adjustment disorder with mixed anxiety and depressed mood [F43.23] 02/04/2015    Follow-up Information    Follow up with [facility].   Specialty:  Behavioral Health   Why:  Go to your next scheduled appointment.  If it is more than 3 weeks out, go to the walk in clinic M-F between 8 and 11AM for your hospital follow up appointment   Contact information:   [address] [place] [address] [phone]       [facility].   Why:  Thursday at 10:00 with Lorelee Marketracy Anthony   Contact information:   [address] [place] [336] [phone]      Plan Of Care/Follow-up recommendations:  Activity:  as tolerated  Diet:  Allergic to peanuts, melon.  Tests:  NA Other:  See below   Is patient  on multiple antipsychotic therapies at discharge:  No   Has Patient had three or more failed trials of antipsychotic monotherapy by history:  No  Recommended Plan for Multiple Antipsychotic Therapies: NA  Patient discharging from unit in good spirits Plans to follow up as above    Norton Bivins 02/11/2015, 10:23 AM

## 2015-02-11 NOTE — Progress Notes (Signed)
  [facility] Management Discharge Plan :  Will you be returning to the same living situation after discharge:  Yes,  Pt returning home At discharge, do you have transportation home?: Yes,  Pt has her car at the hospital; will provider her own transportation Do you have the ability to pay for your medications: Yes,  Pt provided with 7-day supply with prescriptions  Release of information consent forms completed and in the chart;  Patient's signature needed at discharge.  Patient to Follow up at: Follow-up Information    [facility]   Specialty:  Behavioral Health   Why:  Go to your next scheduled appointment.  If it is more than 3 weeks out, go to the walk in clinic M-F between 8 and 11AM for your hospital follow up appointment   Contact information:   [address] [place] [address] [phone]       [facility].   Why:  Thursday at 10:00 with Lorelee Marketracy Anthony   Contact information:   [address] [place] [phone]      Next level of care provider has [facility] Link:no  Safety Planning and Suicide Prevention discussed: Yes,  with sister; see SPE note for further details  Have you used any form of tobacco in the last 30 days? (Cigarettes, Smokeless Tobacco, Cigars, and/or Pipes): No  Has patient been referred to the Quitline?: N/A patient is not a smoker  Patient has been referred for addiction treatment: N/A  Elaina HoopsCarter, Keyairra Kolinski M 02/11/2015, 1:01 PM

## 2015-02-11 NOTE — Progress Notes (Signed)
D   Pt is pleasant on approach and cooperative   She attends groups and participates   Pt interacts well with others    She reports some improvement in her mood but continues to endorse depression and anxiety A   Verbal support given   Medications administered and effectiveness monitored    Q 15 min checks R    Pt safe at present 

## 2015-02-11 NOTE — Tx Team (Signed)
Interdisciplinary Treatment Plan Update (Adult)  Date:  02/11/2015   Time Reviewed:  9:05 AM   Progress in Treatment: Attending groups: Yes. Participating in groups:  Yes. Taking medication as prescribed:  Yes. Tolerating medication:  Yes. Family/Significant other contact made:  Yes Patient understands diagnosis:  Yes  As evidenced by seeking help with depression and anxiety Discussing patient identified problems/goals with staff:  Yes, see initial care plan. Medical problems stabilized or resolved:  Yes. Denies suicidal/homicidal ideation: Yes. Issues/concerns per patient self-inventory:  No. Other:  New problem(s) identified: None identified at this time.   Discharge Plan or Barriers:  See below  Reason for Continuation of Hospitalization: Anxiety Depression Medication stabilization  Comments:  Pam Thomas is an 49 y.o. female who presents reporting symptoms of severe depression and PTSD since the loss of her daughter to an opioid overdose in September 2016. Pt states she is unable to work, and is having difficulty functioning including completing her ADLs (forgets to rinse her teeth while brushing until later on, etc). She is so anxious she is having difficulty driving, and is experiencing agoraphobia, and startling at noises that should not be scary. Pt states that she is having flashbacks of her daughter dying. She was present when 911 was called and all throughout the ER experience, including when her daughter was taken off life support. Wellbutrin, Buspar, Seroquel trial  Estimated length of stay:  1-2 days  New goal(s):  Review of initial/current patient goals per problem list:   Review of initial/current patient goals per problem list:  1. Goal(s): Patient will participate in aftercare plan   Met: Yes   Target date: 3-5 days post admission date   As evidenced by: Patient will participate within aftercare plan AEB aftercare provider and housing plan at  discharge being identified. 02/05/15:  Return home, follow up outpt   2. Goal (s): Patient will exhibit decreased depressive symptoms and suicidal ideations.   Met: Yes   Target date: 3-5 days post admission date   As evidenced by: Patient will utilize self rating of depression at 3 or below and demonstrate decreased signs of depression or be deemed stable for discharge by MD. 02/05/15:  Rates her depression a 9 today 02/10/15: Pt rates depression at 0/10; denies SI    3. Goal(s): Patient will demonstrate decreased signs and symptoms of anxiety.   Met: Yes   Target date: 3-5 days post admission date   As evidenced by: Patient will utilize self rating of anxiety at 3 or below and demonstrated decreased signs of anxiety, or be deemed stable for discharge by MD 02/05/15  Rates her anxiety a 9. 02/10/15: Pt rates anxiety at 2-3/10          Attendees: Patient:  02/11/2015 9:05 AM   Family:   02/11/2015 9:05 AM   Physician:  Fernando Cobos, MD 02/11/2015 9:05 AM   Nursing:   Beverly Knight, RN 02/11/2015 9:05 AM   CSW:     Carter, LCSWA   02/11/2015 9:05 AM   Other:  02/11/2015 9:05 AM   Other:   02/11/2015 9:05 AM   Other:  Jennifer Clark, Nurse CM 02/11/2015 9:05 AM   Other:   02/11/2015 9:05 AM   Other:  Delora Sutton, P4CC  02/11/2015 9:05 AM   Other:  02/11/2015 9:05 AM   Other:  02/11/2015 9:05 AM   Other:  02/11/2015 9:05 AM   Other:  02/11/2015 9:05 AM   Other:  02/11/2015 9:05 AM     Other:   02/11/2015 9:05 AM    Scribe for Treatment Team:   Peri Maris, [employer] [phone]

## 2015-02-11 NOTE — Progress Notes (Signed)
Patient ID: Pam Thomas, female   DOB: 04/03/1965, 49 y.o.   MRN: [id number]  Pt. Denies SI/HI and A/V hallucinations. Belongings returned to patient at time of discharge. Patient denies any pain or discomfort. Discharge instructions and medications were reviewed with patient. Patient verbalized understanding of both medications and discharge instructions. She was discharged where she was to leave in her car in the parking lot. Q15 minute safety checks maintained until discharge. No distress upon discharge.

## 2015-06-17 IMAGING — CR DG CHEST 2V
2 series · 2 of 2 positions shown · non-contrast
Comparison: 11/12/2013

CLINICAL DATA: Asthma attack tonight, cough, symptoms improved
after inhaler use, former smoker

EXAM:
CHEST  2 VIEW

[chest pa]
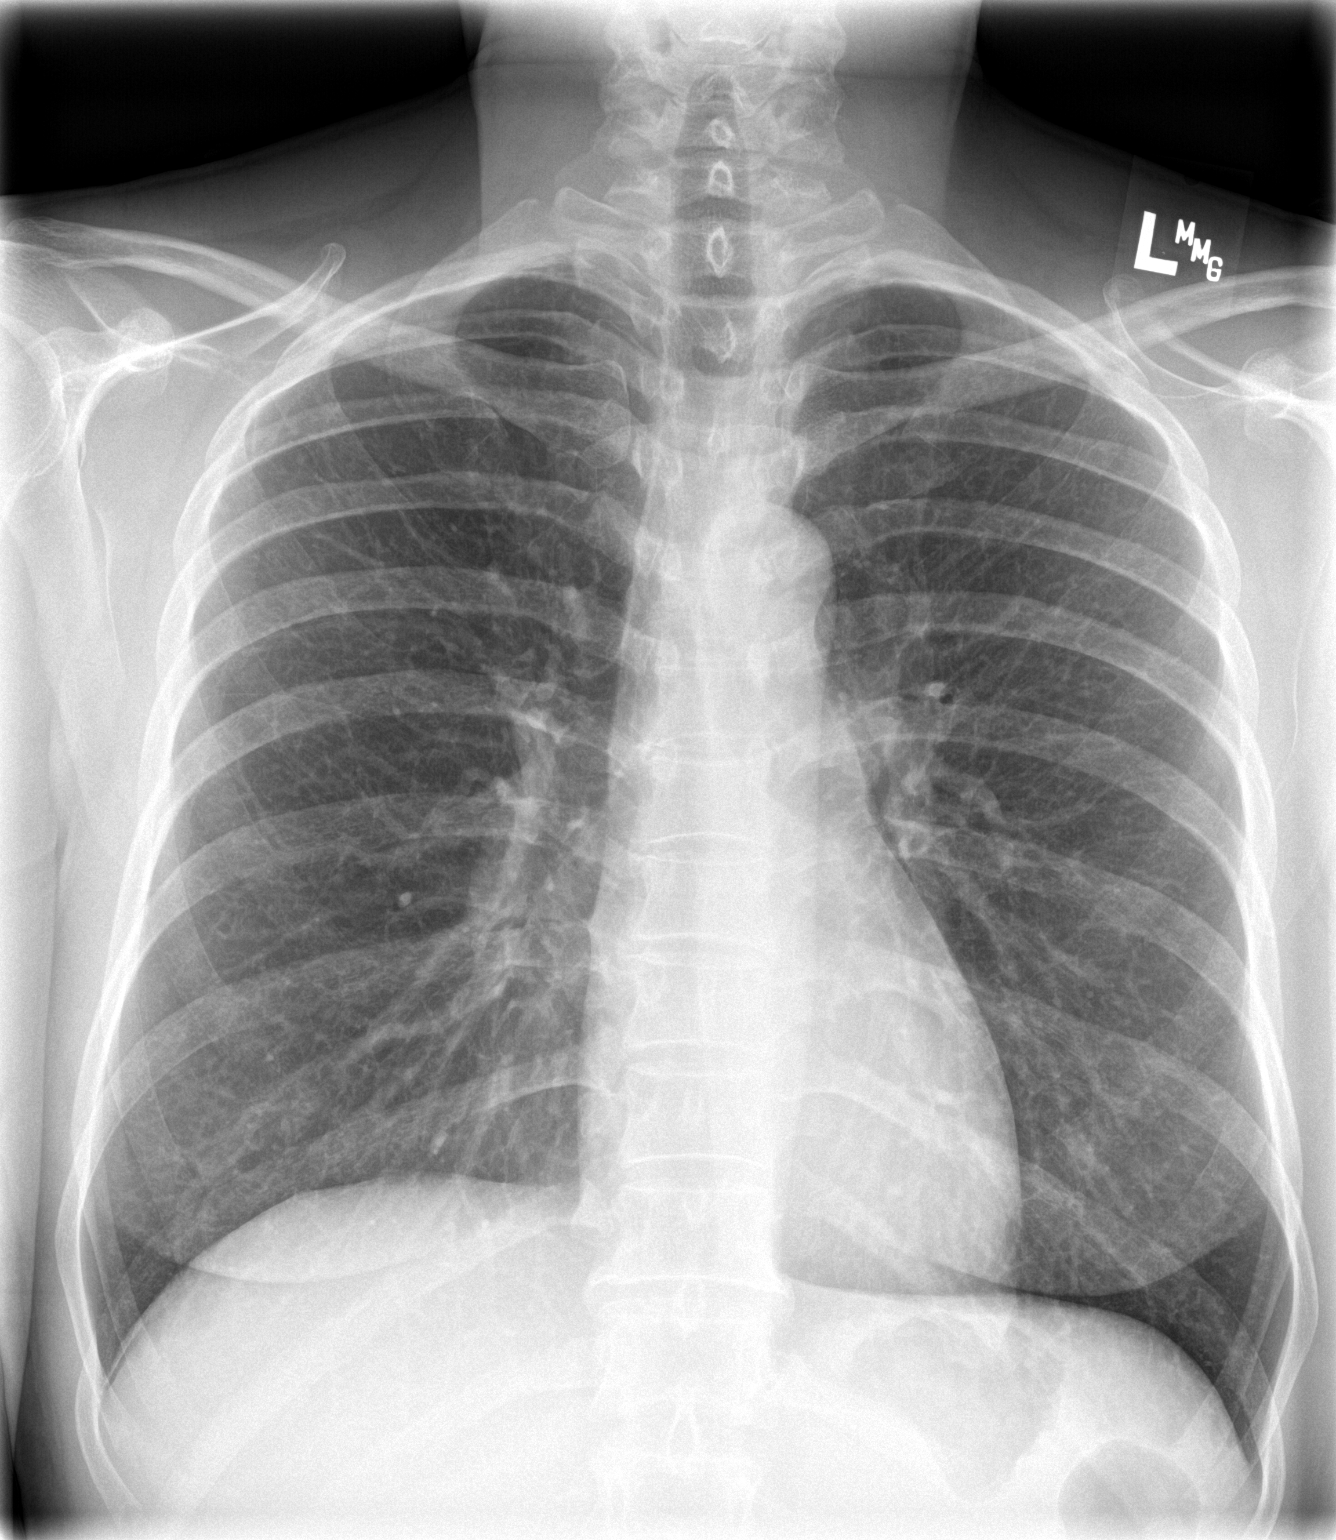

[chest lat]
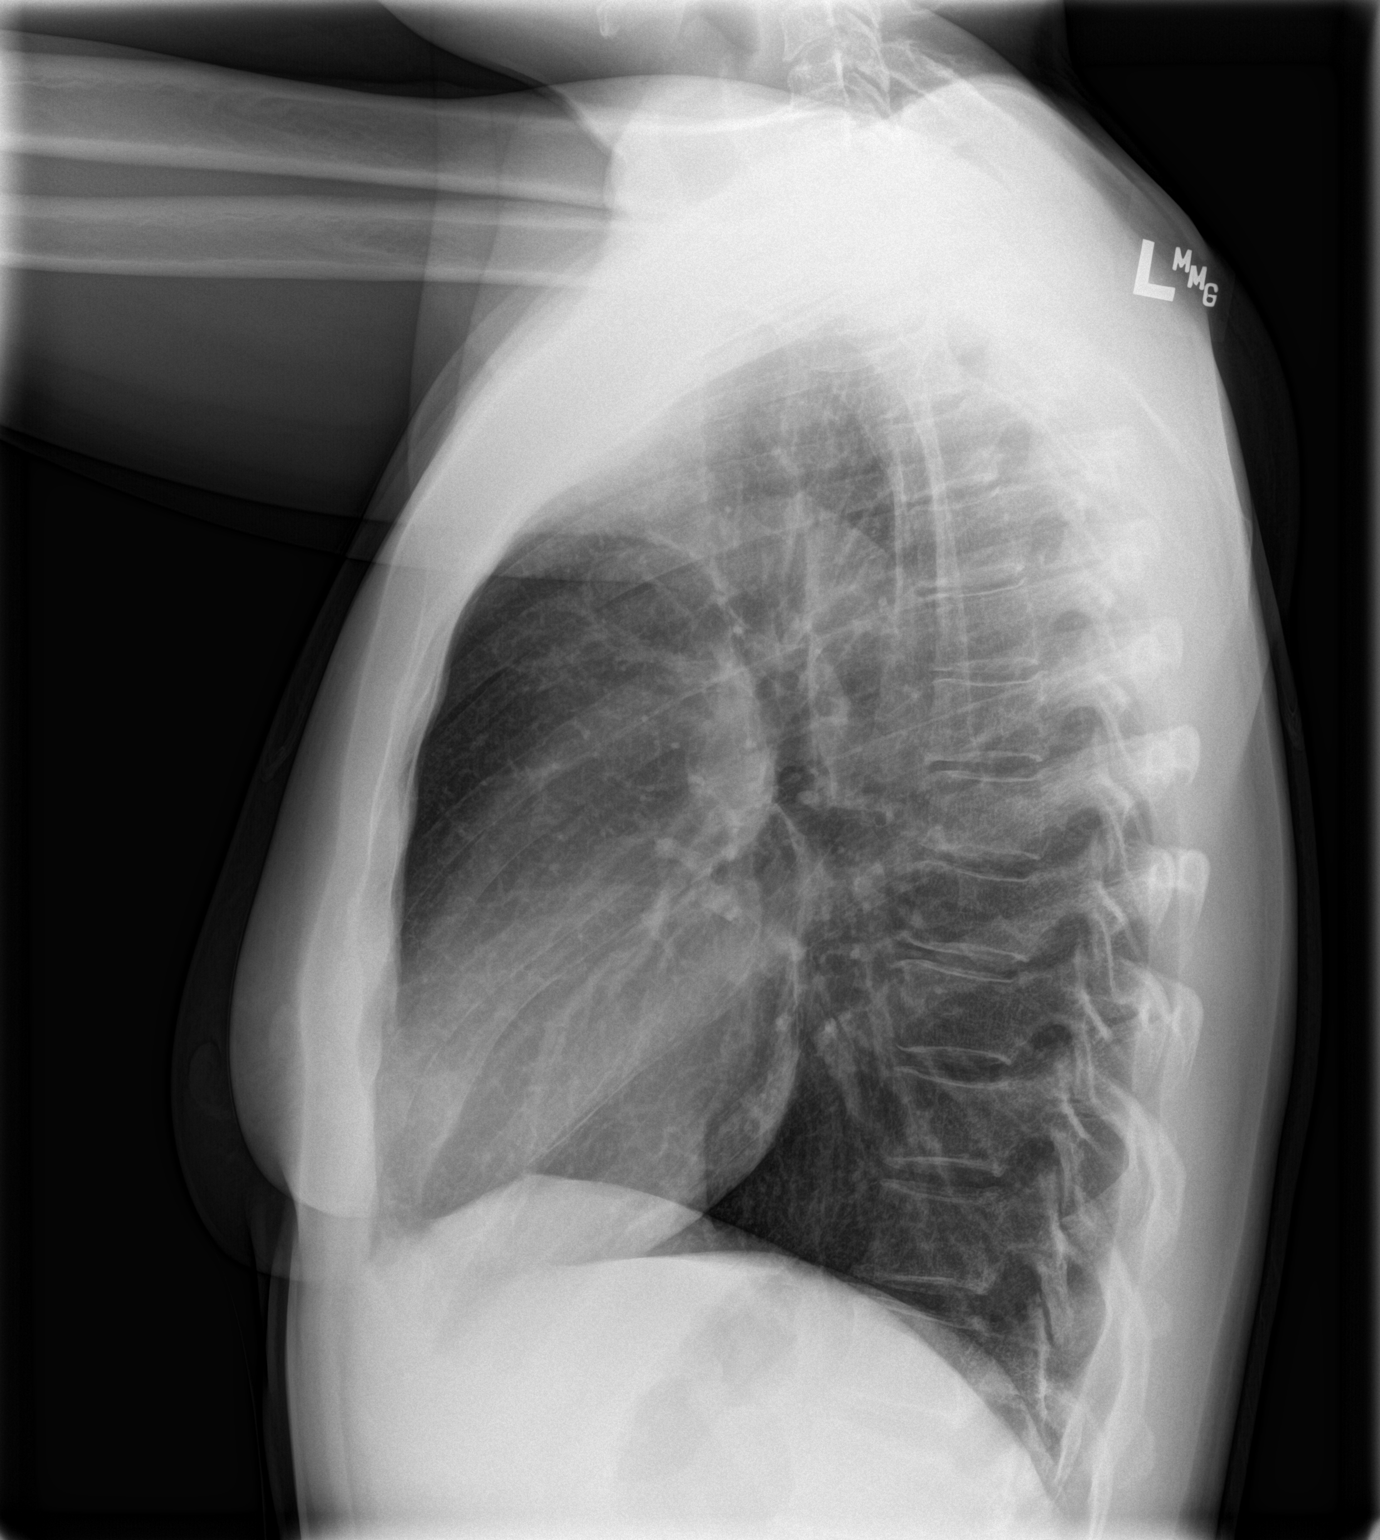

[2 of 2 positions shown; findings below may reference images not displayed]

FINDINGS: Normal heart size, mediastinal contours, and pulmonary vascularity.

Lungs clear.

No pleural effusion or pneumothorax.

Bones unremarkable.
IMPRESSION: Normal exam.
